# Patient Record
Sex: Female | Born: 1975 | Race: White | Hispanic: No | Smoking: Current every day smoker
Health system: Southern US, Community
[De-identification: ages and names within clinical notes are randomized; demographics above are authoritative.]

## PROBLEM LIST (undated history)

## (undated) DIAGNOSIS — R519 Headache, unspecified: Secondary | ICD-10-CM

## (undated) HISTORY — DX: Headache, unspecified: R51.9

## (undated) HISTORY — PX: APPENDECTOMY: SHX54

---

## 2009-02-25 ENCOUNTER — Other Ambulatory Visit: Admission: RE | Admit: 2009-02-25 | Discharge: 2009-02-25 | Payer: Self-pay | Admitting: Obstetrics and Gynecology

## 2009-06-21 ENCOUNTER — Emergency Department (HOSPITAL_COMMUNITY): Admission: EM | Admit: 2009-06-21 | Discharge: 2009-06-21 | Payer: Self-pay | Admitting: Emergency Medicine

## 2009-06-22 ENCOUNTER — Inpatient Hospital Stay (HOSPITAL_COMMUNITY): Admission: EM | Admit: 2009-06-22 | Discharge: 2009-06-27 | Payer: Self-pay | Admitting: Emergency Medicine

## 2009-06-22 ENCOUNTER — Encounter (INDEPENDENT_AMBULATORY_CARE_PROVIDER_SITE_OTHER): Payer: Self-pay | Admitting: General Surgery

## 2009-10-20 ENCOUNTER — Emergency Department (HOSPITAL_COMMUNITY): Admission: EM | Admit: 2009-10-20 | Discharge: 2009-10-20 | Payer: Self-pay | Admitting: Emergency Medicine

## 2010-05-13 LAB — HEPATIC FUNCTION PANEL
Albumin: 4.2 g/dL (ref 3.5–5.2)
Bilirubin, Direct: 0.1 mg/dL (ref 0.0–0.3)
Total Bilirubin: 0.3 mg/dL (ref 0.3–1.2)

## 2010-05-13 LAB — CBC
HCT: 39.5 % (ref 36.0–46.0)
Hemoglobin: 12.1 g/dL (ref 12.0–15.0)
MCHC: 35 g/dL (ref 30.0–36.0)
MCHC: 35.3 g/dL (ref 30.0–36.0)
MCV: 88.7 fL (ref 78.0–100.0)
MCV: 87.7 fL (ref 78.0–100.0)
MCV: 88 fL (ref 78.0–100.0)
Platelets: 146 10*3/uL — ABNORMAL LOW (ref 150–400)
Platelets: 162 10*3/uL (ref 150–400)
Platelets: 186 10*3/uL (ref 150–400)
RBC: 3.6 MIL/uL — ABNORMAL LOW (ref 3.87–5.11)
RBC: 3.84 MIL/uL — ABNORMAL LOW (ref 3.87–5.11)
RDW: 12.9 % (ref 11.5–15.5)

## 2010-05-13 LAB — WOUND CULTURE

## 2010-05-13 LAB — URINALYSIS, ROUTINE W REFLEX MICROSCOPIC
Leukocytes, UA: NEGATIVE
Nitrite: NEGATIVE
Specific Gravity, Urine: >1.03 — ABNORMAL HIGH (ref 1.005–1.030)
Urobilinogen, UA: 0.2 mg/dL (ref 0.0–1.0)

## 2010-05-13 LAB — RAPID URINE DRUG SCREEN, HOSP PERFORMED
Cocaine: NOT DETECTED
Opiates: NOT DETECTED

## 2010-05-13 LAB — BASIC METABOLIC PANEL
BUN: 10 mg/dL (ref 6–23)
CO2: 25 mEq/L (ref 19–32)
Calcium: 7.7 mg/dL — ABNORMAL LOW (ref 8.4–10.5)
Chloride: 106 mEq/L (ref 96–112)
Chloride: 107 mEq/L (ref 96–112)
Creatinine, Ser: 0.75 mg/dL (ref 0.4–1.2)
Creatinine, Ser: 0.83 mg/dL (ref 0.4–1.2)
Creatinine, Ser: 0.74 mg/dL (ref 0.4–1.2)
GFR calc Af Amer: >60 mL/min (ref >60)
GFR calc non Af Amer: >60 mL/min (ref >60)
Glucose, Bld: 132 mg/dL — ABNORMAL HIGH (ref 70–99)
Potassium: 4.3 mEq/L (ref 3.5–5.1)
Sodium: 134 mEq/L — ABNORMAL LOW (ref 135–145)
Sodium: 136 mEq/L (ref 135–145)
Sodium: 135 mEq/L (ref 135–145)

## 2010-05-13 LAB — DIFFERENTIAL
Basophils Absolute: 0.1 10*3/uL (ref 0.0–0.1)
Basophils Absolute: 0 10*3/uL (ref 0.0–0.1)
Basophils Absolute: 0.1 10*3/uL (ref 0.0–0.1)
Basophils Relative: 0 % (ref 0–1)
Basophils Relative: 0 % (ref 0–1)
Eosinophils Absolute: 0 10*3/uL (ref 0.0–0.7)
Eosinophils Absolute: 0.1 10*3/uL (ref 0.0–0.7)
Eosinophils Absolute: 0 10*3/uL (ref 0.0–0.7)
Eosinophils Absolute: 0 10*3/uL (ref 0.0–0.7)
Eosinophils Relative: 0 % (ref 0–5)
Eosinophils Relative: 1 % (ref 0–5)
Lymphocytes Relative: 9 % — ABNORMAL LOW (ref 12–46)
Lymphocytes Relative: 6 % — ABNORMAL LOW (ref 12–46)
Lymphs Abs: 1 10*3/uL (ref 0.7–4.0)
Monocytes Absolute: 0.4 10*3/uL (ref 0.1–1.0)
Monocytes Absolute: 0.4 10*3/uL (ref 0.1–1.0)
Monocytes Relative: 5 % (ref 3–12)
Neutro Abs: 10.1 10*3/uL — ABNORMAL HIGH (ref 1.7–7.7)
Neutro Abs: 17.6 10*3/uL — ABNORMAL HIGH (ref 1.7–7.7)
Neutrophils Relative %: 85 % — ABNORMAL HIGH (ref 43–77)
Neutrophils Relative %: 95 % — ABNORMAL HIGH (ref 43–77)

## 2010-05-13 LAB — ANAEROBIC CULTURE

## 2010-05-13 LAB — COMPREHENSIVE METABOLIC PANEL
ALT: 13 U/L (ref 0–35)
Albumin: 3.7 g/dL (ref 3.5–5.2)
BUN: 10 mg/dL (ref 6–23)
Chloride: 102 mEq/L (ref 96–112)
GFR calc Af Amer: >60 mL/min (ref >60)
GFR calc non Af Amer: 59 mL/min — ABNORMAL LOW (ref >60)

## 2010-05-13 LAB — POCT CARDIAC MARKERS
CKMB, poc: <1 ng/mL — ABNORMAL LOW (ref 1.0–8.0)
Troponin i, poc: <0.05 ng/mL (ref 0.00–0.09)

## 2010-05-13 LAB — URINE MICROSCOPIC-ADD ON

## 2010-05-13 LAB — WET PREP, GENITAL

## 2011-01-13 ENCOUNTER — Other Ambulatory Visit: Payer: Self-pay | Admitting: Adult Health

## 2011-01-13 ENCOUNTER — Other Ambulatory Visit (HOSPITAL_COMMUNITY)
Admission: RE | Admit: 2011-01-13 | Discharge: 2011-01-13 | Disposition: A | Discharge status: Home / Self Care | Payer: 59 | Source: Ambulatory Visit | Attending: Obstetrics and Gynecology | Admitting: Obstetrics and Gynecology

## 2011-01-13 DIAGNOSIS — Z01419 Encounter for gynecological examination (general) (routine) without abnormal findings: Secondary | ICD-10-CM | POA: Insufficient documentation

## 2011-01-13 DIAGNOSIS — Z113 Encounter for screening for infections with a predominantly sexual mode of transmission: Secondary | ICD-10-CM | POA: Insufficient documentation

## 2011-10-05 ENCOUNTER — Other Ambulatory Visit (HOSPITAL_COMMUNITY): Payer: Self-pay | Admitting: Pulmonary Disease

## 2011-10-05 ENCOUNTER — Ambulatory Visit (HOSPITAL_COMMUNITY)
Admission: RE | Admit: 2011-10-05 | Discharge: 2011-10-05 | Disposition: A | Discharge status: Home / Self Care | Payer: 59 | Source: Ambulatory Visit | Attending: Pulmonary Disease | Admitting: Pulmonary Disease

## 2011-10-05 DIAGNOSIS — M25552 Pain in left hip: Secondary | ICD-10-CM

## 2011-10-05 DIAGNOSIS — M25559 Pain in unspecified hip: Secondary | ICD-10-CM | POA: Insufficient documentation

## 2012-10-20 ENCOUNTER — Telehealth: Payer: Self-pay | Admitting: Adult Health

## 2012-10-20 NOTE — Telephone Encounter (Signed)
Called [facility], will fax prior authorization for pt to receive amitiza 8 mcg.

## 2012-11-09 ENCOUNTER — Other Ambulatory Visit: Payer: Self-pay | Admitting: Adult Health

## 2013-07-18 ENCOUNTER — Ambulatory Visit (HOSPITAL_COMMUNITY)
Admission: RE | Admit: 2013-07-18 | Discharge: 2013-07-18 | Disposition: A | Discharge status: Home / Self Care | Payer: 59 | Source: Ambulatory Visit | Attending: Pulmonary Disease | Admitting: Pulmonary Disease

## 2013-07-18 ENCOUNTER — Other Ambulatory Visit (HOSPITAL_COMMUNITY): Payer: Self-pay | Admitting: Pulmonary Disease

## 2013-07-18 DIAGNOSIS — M25519 Pain in unspecified shoulder: Secondary | ICD-10-CM | POA: Insufficient documentation

## 2013-07-18 DIAGNOSIS — M25559 Pain in unspecified hip: Secondary | ICD-10-CM | POA: Insufficient documentation

## 2013-07-18 DIAGNOSIS — M25569 Pain in unspecified knee: Secondary | ICD-10-CM | POA: Insufficient documentation

## 2013-12-25 ENCOUNTER — Other Ambulatory Visit: Payer: Self-pay | Admitting: *Deleted

## 2013-12-25 ENCOUNTER — Other Ambulatory Visit: Payer: Self-pay | Admitting: Adult Health

## 2013-12-25 MED ORDER — LUBIPROSTONE 8 MCG PO CAPS: ORAL_CAPSULE | ORAL | Status: DC

## 2014-11-01 ENCOUNTER — Ambulatory Visit (INDEPENDENT_AMBULATORY_CARE_PROVIDER_SITE_OTHER): Payer: Medicaid Other | Admitting: Otolaryngology

## 2014-11-01 DIAGNOSIS — H9209 Otalgia, unspecified ear: Secondary | ICD-10-CM

## 2014-11-01 DIAGNOSIS — H6121 Impacted cerumen, right ear: Secondary | ICD-10-CM

## 2014-11-01 DIAGNOSIS — H93293 Other abnormal auditory perceptions, bilateral: Secondary | ICD-10-CM | POA: Diagnosis not present

## 2014-11-29 ENCOUNTER — Ambulatory Visit (INDEPENDENT_AMBULATORY_CARE_PROVIDER_SITE_OTHER): Payer: Medicaid Other | Admitting: Otolaryngology

## 2014-11-29 DIAGNOSIS — H9209 Otalgia, unspecified ear: Secondary | ICD-10-CM | POA: Diagnosis not present

## 2015-09-09 ENCOUNTER — Other Ambulatory Visit (HOSPITAL_COMMUNITY): Payer: Self-pay | Admitting: Respiratory Therapy

## 2015-09-09 DIAGNOSIS — G4733 Obstructive sleep apnea (adult) (pediatric): Secondary | ICD-10-CM

## 2016-08-04 ENCOUNTER — Telehealth (HOSPITAL_COMMUNITY): Payer: Self-pay | Admitting: *Deleted

## 2016-08-04 NOTE — Telephone Encounter (Signed)
left voice message regarding appointment. 
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
## 2016-08-19 ENCOUNTER — Ambulatory Visit (INDEPENDENT_AMBULATORY_CARE_PROVIDER_SITE_OTHER): Payer: Medicaid Other | Admitting: Psychiatry

## 2016-08-19 ENCOUNTER — Encounter (INDEPENDENT_AMBULATORY_CARE_PROVIDER_SITE_OTHER): Payer: Self-pay

## 2016-08-19 VITALS — BP 126/77 | HR 78 | Ht 67.5 in | Wt 197.6 lb

## 2016-08-19 DIAGNOSIS — F411 Generalized anxiety disorder: Secondary | ICD-10-CM | POA: Diagnosis not present

## 2016-08-19 DIAGNOSIS — F329 Major depressive disorder, single episode, unspecified: Secondary | ICD-10-CM | POA: Diagnosis not present

## 2016-08-19 DIAGNOSIS — G40909 Epilepsy, unspecified, not intractable, without status epilepticus: Secondary | ICD-10-CM

## 2016-08-19 DIAGNOSIS — Z79899 Other long term (current) drug therapy: Secondary | ICD-10-CM

## 2016-08-19 DIAGNOSIS — K219 Gastro-esophageal reflux disease without esophagitis: Secondary | ICD-10-CM | POA: Diagnosis not present

## 2016-08-19 DIAGNOSIS — G8929 Other chronic pain: Secondary | ICD-10-CM

## 2016-08-19 DIAGNOSIS — Z818 Family history of other mental and behavioral disorders: Secondary | ICD-10-CM

## 2016-08-19 MED ORDER — MIRTAZAPINE 7.5 MG PO TABS: ORAL_TABLET | ORAL | 1 refills | Status: DC

## 2016-08-19 NOTE — Patient Instructions (Signed)
1. Start mirtazapine 7.5 mg at night for one week, then 15 mg at night 2. Decrease lamotrigine 25 mg twice a day for three days, then discontinue 3. Discontinue buspar 4. Return to clinic in one month for 30 mins 5. Referral to therapy (Ms. Peggy Bynum)

## 2016-09-09 ENCOUNTER — Telehealth (HOSPITAL_COMMUNITY): Payer: Self-pay | Admitting: *Deleted

## 2016-09-09 NOTE — Telephone Encounter (Signed)
voice message from patient, please call her regarding her Remeron.
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
## 2016-09-16 ENCOUNTER — Ambulatory Visit (HOSPITAL_COMMUNITY): Payer: Self-pay | Admitting: Psychiatry

## 2016-09-16 ENCOUNTER — Other Ambulatory Visit (HOSPITAL_COMMUNITY): Payer: Self-pay | Admitting: Psychiatry

## 2016-09-16 ENCOUNTER — Telehealth (HOSPITAL_COMMUNITY): Payer: Self-pay | Admitting: *Deleted

## 2016-09-16 MED ORDER — MIRTAZAPINE 15 MG PO TABS: 15.0000 mg | ORAL_TABLET | Freq: Every day | ORAL | 0 refills | Status: DC

## 2016-09-16 NOTE — Telephone Encounter (Signed)
lmtcb

## 2016-09-16 NOTE — Telephone Encounter (Signed)
Prior authorization for Remeron received. Called [place] [facility] spoke with Morrie Sheldonshley who states that no authorization is required but the pharmacy needs to call for an over ride code. Call interaction [id number]. Called to notify the pharmacy. Pharmacist stated that patient filled medication at another [facility] pharmacy 4 days ago.

## 2016-09-16 NOTE — Telephone Encounter (Signed)
Per pt chart, provider filled her Remeron on 08-19-2016 with 1 refill. Called pt today to get more information. Per pt, the way the doctor wrote the script out she should not be out of refills but the insurance is only wanting to fill 30 days worth instead of 60 tabs. Informed pt that means her Remeron is needing a Prior Auth and office will send it to the person that does it and see if she could get it completed today. Per pt, she's wondering if Dr. Vanetta ShawlHisada could at least give her 15 mg and she can at least cut that in half while she waits because she is currently not taking the Remeron due to being out due to not having enough. Pt number is [phone].

## 2016-09-16 NOTE — Telephone Encounter (Signed)
Ordered mirtazapine 15 mg at night for one month. (She may start from 7.5 mg at night for one week then increase to 15 mg qhs if she has not received 7.5 mg)

## 2016-09-17 NOTE — Telephone Encounter (Signed)
Spoke with pt and informed her that the person that does PA for medications for the office stated that pt 7.5 mg leading to 15 mg, she was informed from the insurance that the pharmacy needed to call them for an over ride code. Also informed pt that provider sent the 15 mg to her pharmacy as well. Pt verbalized understanding and stated she will call her pharmacy when they open up.

## 2016-09-17 NOTE — Telephone Encounter (Signed)
noted 
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
## 2016-09-23 ENCOUNTER — Ambulatory Visit (INDEPENDENT_AMBULATORY_CARE_PROVIDER_SITE_OTHER): Payer: Medicaid Other | Admitting: Psychiatry

## 2016-09-23 ENCOUNTER — Encounter (HOSPITAL_COMMUNITY): Payer: Self-pay | Admitting: Psychiatry

## 2016-09-23 VITALS — BP 109/88 | HR 90 | Ht 67.5 in | Wt 192.8 lb

## 2016-09-23 DIAGNOSIS — F411 Generalized anxiety disorder: Secondary | ICD-10-CM | POA: Diagnosis not present

## 2016-09-23 DIAGNOSIS — F17210 Nicotine dependence, cigarettes, uncomplicated: Secondary | ICD-10-CM

## 2016-09-23 DIAGNOSIS — G4700 Insomnia, unspecified: Secondary | ICD-10-CM

## 2016-09-23 MED ORDER — ZOLPIDEM TARTRATE 5 MG PO TABS: ORAL_TABLET | ORAL | 0 refills | Status: DC

## 2016-09-23 MED ORDER — MIRTAZAPINE 15 MG PO TABS: 15.0000 mg | ORAL_TABLET | Freq: Every day | ORAL | 1 refills | Status: DC

## 2016-09-23 NOTE — Patient Instructions (Addendum)
1. Continue mirtazapine 15 mg at night 2. Start amibien 2.5-5 mg at night as needed for sleep 3. Return to clinic in six weeks for 30 mins
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
## 2016-11-03 ENCOUNTER — Telehealth (HOSPITAL_COMMUNITY): Payer: Self-pay | Admitting: *Deleted

## 2016-11-03 NOTE — Telephone Encounter (Signed)
returned phone call to patient, informed her that her appointment scheduled 11/04/16 has been cancelled as she requested.    Please call to reschedule.

## 2016-11-04 ENCOUNTER — Ambulatory Visit (HOSPITAL_COMMUNITY): Payer: Self-pay | Admitting: Psychiatry

## 2016-11-11 ENCOUNTER — Telehealth (HOSPITAL_COMMUNITY): Payer: Self-pay | Admitting: Psychiatry
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
## 2016-11-27 ENCOUNTER — Ambulatory Visit (INDEPENDENT_AMBULATORY_CARE_PROVIDER_SITE_OTHER): Payer: Medicaid Other | Admitting: Psychiatry

## 2016-11-27 VITALS — BP 100/76 | HR 76 | Ht 67.52 in | Wt 177.6 lb

## 2016-11-27 DIAGNOSIS — G8929 Other chronic pain: Secondary | ICD-10-CM

## 2016-11-27 DIAGNOSIS — G4700 Insomnia, unspecified: Secondary | ICD-10-CM | POA: Diagnosis not present

## 2016-11-27 DIAGNOSIS — Z79899 Other long term (current) drug therapy: Secondary | ICD-10-CM | POA: Diagnosis not present

## 2016-11-27 DIAGNOSIS — F411 Generalized anxiety disorder: Secondary | ICD-10-CM | POA: Diagnosis not present

## 2016-11-27 DIAGNOSIS — G43909 Migraine, unspecified, not intractable, without status migrainosus: Secondary | ICD-10-CM

## 2016-11-27 DIAGNOSIS — K219 Gastro-esophageal reflux disease without esophagitis: Secondary | ICD-10-CM | POA: Diagnosis not present

## 2016-11-27 DIAGNOSIS — F17200 Nicotine dependence, unspecified, uncomplicated: Secondary | ICD-10-CM | POA: Diagnosis not present

## 2016-11-27 MED ORDER — LORAZEPAM 0.5 MG PO TABS: 0.5000 mg | ORAL_TABLET | Freq: Every day | ORAL | 0 refills | Status: DC | PRN

## 2016-11-27 MED ORDER — MIRTAZAPINE 30 MG PO TABS: 30.0000 mg | ORAL_TABLET | Freq: Every day | ORAL | 0 refills | Status: DC

## 2016-11-27 MED ORDER — LORAZEPAM 0.5 MG PO TABS: 0.5000 mg | ORAL_TABLET | Freq: Every day | ORAL | 1 refills | Status: DC | PRN

## 2016-11-27 NOTE — Patient Instructions (Signed)
1. Increase mirtazapine 30 mg at night 2. Start ativan 0.5 mg daily as needed for anxiety 3. Return to clinic in six to seven weeks for 30 mins

## 2016-12-30 ENCOUNTER — Other Ambulatory Visit (HOSPITAL_COMMUNITY): Payer: Self-pay | Admitting: Psychiatry

## 2016-12-30 MED ORDER — MIRTAZAPINE 30 MG PO TABS: 30.0000 mg | ORAL_TABLET | Freq: Every day | ORAL | 0 refills | Status: DC
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
## 2017-01-12 ENCOUNTER — Encounter (HOSPITAL_COMMUNITY): Payer: Self-pay | Admitting: Psychiatry

## 2017-01-12 ENCOUNTER — Ambulatory Visit (INDEPENDENT_AMBULATORY_CARE_PROVIDER_SITE_OTHER): Payer: Medicaid Other | Admitting: Psychiatry

## 2017-01-12 VITALS — BP 116/76 | HR 90 | Ht 67.52 in | Wt 173.0 lb

## 2017-01-12 DIAGNOSIS — R450 Nervousness: Secondary | ICD-10-CM | POA: Diagnosis not present

## 2017-01-12 DIAGNOSIS — R45850 Homicidal ideations: Secondary | ICD-10-CM

## 2017-01-12 DIAGNOSIS — R51 Headache: Secondary | ICD-10-CM | POA: Diagnosis not present

## 2017-01-12 DIAGNOSIS — F411 Generalized anxiety disorder: Secondary | ICD-10-CM

## 2017-01-12 DIAGNOSIS — G43909 Migraine, unspecified, not intractable, without status migrainosus: Secondary | ICD-10-CM | POA: Diagnosis not present

## 2017-01-12 DIAGNOSIS — F17210 Nicotine dependence, cigarettes, uncomplicated: Secondary | ICD-10-CM | POA: Diagnosis not present

## 2017-01-12 DIAGNOSIS — F419 Anxiety disorder, unspecified: Secondary | ICD-10-CM

## 2017-01-12 DIAGNOSIS — K219 Gastro-esophageal reflux disease without esophagitis: Secondary | ICD-10-CM

## 2017-01-12 DIAGNOSIS — Z818 Family history of other mental and behavioral disorders: Secondary | ICD-10-CM | POA: Diagnosis not present

## 2017-01-12 DIAGNOSIS — G4700 Insomnia, unspecified: Secondary | ICD-10-CM | POA: Diagnosis not present

## 2017-01-12 DIAGNOSIS — F4310 Post-traumatic stress disorder, unspecified: Secondary | ICD-10-CM

## 2017-01-12 MED ORDER — DULOXETINE HCL 30 MG PO CPEP: 30.0000 mg | ORAL_CAPSULE | Freq: Every day | ORAL | 0 refills | Status: DC

## 2017-01-12 MED ORDER — MIRTAZAPINE 30 MG PO TABS: 30.0000 mg | ORAL_TABLET | Freq: Every day | ORAL | 0 refills | Status: DC

## 2017-01-12 NOTE — Patient Instructions (Signed)
1. Continue mirtazapine 30 mg at night 2. Start duloxetine 30 mg daily  3. Discontinue ativan  4. Return to clinic in one month for 30 mins

## 2017-01-19 DIAGNOSIS — Z1231 Encounter for screening mammogram for malignant neoplasm of breast: Secondary | ICD-10-CM | POA: Diagnosis not present
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
## 2017-02-12 ENCOUNTER — Ambulatory Visit (INDEPENDENT_AMBULATORY_CARE_PROVIDER_SITE_OTHER): Payer: Medicaid Other | Admitting: Psychiatry

## 2017-02-12 ENCOUNTER — Encounter (HOSPITAL_COMMUNITY): Payer: Self-pay | Admitting: Psychiatry

## 2017-02-12 VITALS — BP 110/77 | HR 77 | Ht 67.0 in | Wt 164.0 lb

## 2017-02-12 DIAGNOSIS — Z79899 Other long term (current) drug therapy: Secondary | ICD-10-CM | POA: Diagnosis not present

## 2017-02-12 DIAGNOSIS — F17200 Nicotine dependence, unspecified, uncomplicated: Secondary | ICD-10-CM | POA: Diagnosis not present

## 2017-02-12 DIAGNOSIS — G43909 Migraine, unspecified, not intractable, without status migrainosus: Secondary | ICD-10-CM | POA: Diagnosis not present

## 2017-02-12 DIAGNOSIS — K219 Gastro-esophageal reflux disease without esophagitis: Secondary | ICD-10-CM

## 2017-02-12 DIAGNOSIS — F4310 Post-traumatic stress disorder, unspecified: Secondary | ICD-10-CM | POA: Diagnosis not present

## 2017-02-12 DIAGNOSIS — Z818 Family history of other mental and behavioral disorders: Secondary | ICD-10-CM | POA: Diagnosis not present

## 2017-02-12 DIAGNOSIS — G8929 Other chronic pain: Secondary | ICD-10-CM

## 2017-02-12 DIAGNOSIS — F411 Generalized anxiety disorder: Secondary | ICD-10-CM | POA: Diagnosis not present

## 2017-02-12 MED ORDER — MIRTAZAPINE 30 MG PO TABS: 30.0000 mg | ORAL_TABLET | Freq: Every day | ORAL | 1 refills | Status: DC

## 2017-02-12 MED ORDER — DULOXETINE HCL 60 MG PO CPEP: 60.0000 mg | ORAL_CAPSULE | Freq: Every day | ORAL | 1 refills | Status: DC

## 2017-02-12 NOTE — Patient Instructions (Signed)
1. Continue mirtazapine 30 mg a night  2. Increase duloxetine 60 mg daily  3. Return to clinic in two months for 15 mins

## 2017-04-06 ENCOUNTER — Encounter (HOSPITAL_COMMUNITY): Payer: Self-pay | Admitting: Psychiatry

## 2017-04-06 ENCOUNTER — Ambulatory Visit (INDEPENDENT_AMBULATORY_CARE_PROVIDER_SITE_OTHER): Payer: Medicaid Other | Admitting: Psychiatry

## 2017-04-06 VITALS — BP 104/71 | HR 97 | Ht 67.0 in | Wt 173.0 lb

## 2017-04-06 DIAGNOSIS — F17210 Nicotine dependence, cigarettes, uncomplicated: Secondary | ICD-10-CM | POA: Diagnosis not present

## 2017-04-06 DIAGNOSIS — Z818 Family history of other mental and behavioral disorders: Secondary | ICD-10-CM | POA: Diagnosis not present

## 2017-04-06 DIAGNOSIS — F4310 Post-traumatic stress disorder, unspecified: Secondary | ICD-10-CM

## 2017-04-06 DIAGNOSIS — F411 Generalized anxiety disorder: Secondary | ICD-10-CM | POA: Diagnosis not present

## 2017-04-06 MED ORDER — DULOXETINE HCL 60 MG PO CPEP: ORAL_CAPSULE | ORAL | 1 refills | Status: DC

## 2017-04-06 MED ORDER — DULOXETINE HCL 30 MG PO CPEP: ORAL_CAPSULE | ORAL | 1 refills | Status: DC

## 2017-04-06 MED ORDER — MIRTAZAPINE 30 MG PO TABS: 30.0000 mg | ORAL_TABLET | Freq: Every day | ORAL | 1 refills | Status: DC
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
## 2017-04-06 NOTE — Patient Instructions (Signed)
1. Continue mirtazapine 30 mg a night  2. Increase duloxetine 90 mg daily (60 mg + 30 mg) 3. Return to clinic in two months for 15 mins

## 2017-04-13 ENCOUNTER — Ambulatory Visit (HOSPITAL_COMMUNITY): Payer: Self-pay | Admitting: Psychiatry

## 2017-05-31 ENCOUNTER — Other Ambulatory Visit (HOSPITAL_COMMUNITY): Payer: Self-pay | Admitting: Psychiatry

## 2017-05-31 MED ORDER — DULOXETINE HCL 30 MG PO CPEP: ORAL_CAPSULE | ORAL | 1 refills | Status: DC

## 2017-06-01 ENCOUNTER — Other Ambulatory Visit (HOSPITAL_COMMUNITY): Payer: Self-pay | Admitting: Psychiatry

## 2017-06-01 MED ORDER — DULOXETINE HCL 60 MG PO CPEP: ORAL_CAPSULE | ORAL | 1 refills | Status: DC
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
## 2017-06-14 ENCOUNTER — Encounter (HOSPITAL_COMMUNITY): Payer: Self-pay | Admitting: Psychiatry

## 2017-06-14 ENCOUNTER — Ambulatory Visit (INDEPENDENT_AMBULATORY_CARE_PROVIDER_SITE_OTHER): Payer: Medicaid Other | Admitting: Psychiatry

## 2017-06-14 VITALS — BP 115/80 | HR 92 | Ht 67.0 in | Wt 186.0 lb

## 2017-06-14 DIAGNOSIS — G4700 Insomnia, unspecified: Secondary | ICD-10-CM | POA: Diagnosis not present

## 2017-06-14 DIAGNOSIS — F411 Generalized anxiety disorder: Secondary | ICD-10-CM

## 2017-06-14 DIAGNOSIS — F17210 Nicotine dependence, cigarettes, uncomplicated: Secondary | ICD-10-CM | POA: Diagnosis not present

## 2017-06-14 DIAGNOSIS — F909 Attention-deficit hyperactivity disorder, unspecified type: Secondary | ICD-10-CM | POA: Diagnosis not present

## 2017-06-14 DIAGNOSIS — F4310 Post-traumatic stress disorder, unspecified: Secondary | ICD-10-CM

## 2017-06-14 DIAGNOSIS — Z818 Family history of other mental and behavioral disorders: Secondary | ICD-10-CM

## 2017-06-14 DIAGNOSIS — R450 Nervousness: Secondary | ICD-10-CM | POA: Diagnosis not present

## 2017-06-14 LAB — TSH: TSH: 0.72 m[IU]/L

## 2017-06-14 MED ORDER — DULOXETINE HCL 30 MG PO CPEP: ORAL_CAPSULE | ORAL | 1 refills | Status: DC

## 2017-06-14 MED ORDER — BUPROPION HCL ER (XL) 150 MG PO TB24: 150.0000 mg | ORAL_TABLET | Freq: Every day | ORAL | 1 refills | Status: DC

## 2017-06-14 MED ORDER — MIRTAZAPINE 30 MG PO TABS: 30.0000 mg | ORAL_TABLET | Freq: Every day | ORAL | 1 refills | Status: DC

## 2017-06-14 MED ORDER — DULOXETINE HCL 60 MG PO CPEP: ORAL_CAPSULE | ORAL | 1 refills | Status: DC

## 2017-06-14 NOTE — Patient Instructions (Addendum)
1. Continue mirtazapine 30 mg a night  2. Continue duloxetine 90 mg daily  3. Start wellbutrin 150 mg daily  4. Return to clinic in one month for 15 mins 5. Referral to neuropsychology testing
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
## 2017-07-13 ENCOUNTER — Encounter (HOSPITAL_COMMUNITY): Payer: Self-pay | Admitting: Psychiatry

## 2017-07-13 ENCOUNTER — Ambulatory Visit (INDEPENDENT_AMBULATORY_CARE_PROVIDER_SITE_OTHER): Payer: Medicaid Other | Admitting: Psychiatry

## 2017-07-13 VITALS — BP 117/79 | HR 87 | Ht 67.0 in | Wt 188.0 lb

## 2017-07-13 DIAGNOSIS — Z79899 Other long term (current) drug therapy: Secondary | ICD-10-CM | POA: Diagnosis not present

## 2017-07-13 DIAGNOSIS — F17200 Nicotine dependence, unspecified, uncomplicated: Secondary | ICD-10-CM | POA: Diagnosis not present

## 2017-07-13 DIAGNOSIS — F4310 Post-traumatic stress disorder, unspecified: Secondary | ICD-10-CM | POA: Diagnosis not present

## 2017-07-13 DIAGNOSIS — G43909 Migraine, unspecified, not intractable, without status migrainosus: Secondary | ICD-10-CM

## 2017-07-13 DIAGNOSIS — Z818 Family history of other mental and behavioral disorders: Secondary | ICD-10-CM

## 2017-07-13 DIAGNOSIS — K219 Gastro-esophageal reflux disease without esophagitis: Secondary | ICD-10-CM

## 2017-07-13 DIAGNOSIS — F411 Generalized anxiety disorder: Secondary | ICD-10-CM | POA: Diagnosis not present

## 2017-07-13 DIAGNOSIS — G8929 Other chronic pain: Secondary | ICD-10-CM | POA: Diagnosis not present

## 2017-07-13 MED ORDER — DULOXETINE HCL 30 MG PO CPEP: ORAL_CAPSULE | ORAL | 1 refills | Status: DC

## 2017-07-13 MED ORDER — DULOXETINE HCL 60 MG PO CPEP: ORAL_CAPSULE | ORAL | 1 refills | Status: DC

## 2017-07-13 NOTE — Patient Instructions (Addendum)
1. Decrease mirtazapine 15 mg at night  2. Continue duloxetine 90 mg daily  3. Discontinue Wellbutrin 4. Return to clinic in two months for 15 mins

## 2017-08-13 ENCOUNTER — Encounter: Payer: Medicaid Other | Admitting: Psychology

## 2017-09-01 ENCOUNTER — Other Ambulatory Visit (HOSPITAL_COMMUNITY): Payer: Self-pay | Admitting: Psychiatry

## 2017-09-08 ENCOUNTER — Other Ambulatory Visit (HOSPITAL_COMMUNITY): Payer: Self-pay | Admitting: *Deleted

## 2017-09-08 MED ORDER — MIRTAZAPINE 30 MG PO TABS: 30.0000 mg | ORAL_TABLET | Freq: Every day | ORAL | 0 refills | Status: DC

## 2017-09-08 NOTE — Telephone Encounter (Signed)
Refill request sent by [facility]. Chart reviewed, next appt 7/23 with last fill 08/03/17. Per Dr. Vanetta ShawlHisada OK to refill for a 30 day supply.

## 2017-09-10 NOTE — Progress Notes (Deleted)
BH MD/PA/NP OP Progress Note  09/10/2017 8:35 AM Anna Shepherd  MRN:  [id number]  Chief Complaint:  HPI: *** Visit Diagnosis: No diagnosis found.  Past Psychiatric History: Please see initial evaluation for full details. I have reviewed the history. No updates at this time.     Past Medical History: No past medical history on file. No past surgical history on file.  Family Psychiatric History: Please see initial evaluation for full details. I have reviewed the history. No updates at this time.     Family History:  Family History  Problem Relation Age of Onset  . Depression Mother   . Depression Sister     Social History:  Social History   Socioeconomic History  . Marital status: Married    Spouse name: Not on file  . Number of children: Not on file  . Years of education: Not on file  . Highest education level: Not on file  Occupational History  . Not on file  Social Needs  . Financial resource strain: Not on file  . Food insecurity:    Worry: Not on file    Inability: Not on file  . Transportation needs:    Medical: Not on file    Non-medical: Not on file  Tobacco Use  . Smoking status: Current Every Day Smoker    Packs/day: 1.00  . Smokeless tobacco: Never Used  Substance and Sexual Activity  . Alcohol use: No    Comment: 09-23-2016 PER PT NO  . Drug use: No    Comment: 09-23-2016 PER PT NO   . Sexual activity: Not on file  Lifestyle  . Physical activity:    Days per week: Not on file    Minutes per session: Not on file  . Stress: Not on file  Relationships  . Social connections:    Talks on phone: Not on file    Gets together: Not on file    Attends religious service: Not on file    Active member of club or organization: Not on file    Attends meetings of clubs or organizations: Not on file    Relationship status: Not on file  Other Topics Concern  . Not on file  Social History Narrative  . Not on file    Allergies:  Allergies  Allergen  Reactions  . Azithromycin     Metabolic Disorder Labs: No results found for: HGBA1C, MPG No results found for: PROLACTIN No results found for: CHOL, TRIG, HDL, CHOLHDL, VLDL, LDLCALC Lab Results  Component Value Date   TSH 0.72 06/14/2017    Therapeutic Level Labs: No results found for: LITHIUM No results found for: VALPROATE No components found for:  CBMZ  Current Medications: Current Outpatient Medications  Medication Sig Dispense Refill  . celecoxib (CELEBREX) 200 MG capsule Take 200 mg by mouth daily.  5  . DULoxetine (CYMBALTA) 30 MG capsule Take total of 90 mg daily (60 mg +30 mg) 30 capsule 1  . DULoxetine (CYMBALTA) 60 MG capsule Take total of 90 mg daily (60 mg +30 mg) 30 capsule 1  . mirtazapine (REMERON) 30 MG tablet Take 1 tablet (30 mg total) by mouth at bedtime. 30 tablet 0  . omeprazole (PRILOSEC) 20 MG capsule Take 20 mg by mouth daily.  11  . propranolol (INDERAL) 20 MG tablet Take 20 mg by mouth 2 (two) times daily.  12   No current facility-administered medications for this visit.      Musculoskeletal: Strength &  Muscle Tone: within normal limits Gait & Station: normal Patient leans: N/A  Psychiatric Specialty Exam: ROS  There were no vitals taken for this visit.There is no height or weight on file to calculate BMI.  General Appearance: Fairly Groomed  Eye Contact:  Good  Speech:  Clear and Coherent  Volume:  Normal  Mood:  {BHH MOOD:22306}  Affect:  {Affect (PAA):22687}  Thought Process:  Coherent  Orientation:  Full (Time, Place, and Person)  Thought Content: Logical   Suicidal Thoughts:  {ST/HT (PAA):22692}  Homicidal Thoughts:  {ST/HT (PAA):22692}  Memory:  Immediate;   Good  Judgement:  {Judgement (PAA):22694}  Insight:  {Insight (PAA):22695}  Psychomotor Activity:  Normal  Concentration:  Concentration: Good and Attention Span: Good  Recall:  Good  Fund of Knowledge: Good  Language: Good  Akathisia:  No  Handed:  Right  AIMS (if  indicated): not done  Assets:  Communication Skills Desire for Improvement  ADL's:  Intact  Cognition: WNL  Sleep:  {BHH GOOD/FAIR/POOR:22877}   Screenings:   Assessment and Plan:  Anna Shepherd is a 42 y.o. year old female with a history of anxiety, PTSD, migraine, chronic pain, GERD , who presents for follow up appointment for No diagnosis found.  # GAD # PTSD  Although the patient reports overall improvement in anxiety and PTSD symptoms since the last appointment, she has had weight gain.  We will taper down mirtazapine (for anxiety) to see if it helps for weight loss. Will continue current dose of duloxetine to target anxiety and PTSD; will consider taper down this medication if the patient continues to gain weight despite changing mirtazapine dose.   # r/o ADHD Patient continues to endorse difficulty sustaining attention despite overall improvement in her mood symptoms. She was never diagnosed with ADHD, although she was in special class as a child. Referred to neuropsychology testing. Will discontinue Wellbutrin given patient had adverse reaction of irritability. Will consider stimulant after formal evaluation, which would be also beneficial for appetite suppression.   Plan  1. Decrease mirtazapine 15 mg at night  2. Continue duloxetine 90 mg daily  3. Discontinue Wellbutrin 4.Return to clinic in two months for15 mins 5. Referral to neuropsychology testing 6. TSH reviewed; wnl (She has a couple of ativan 0.5 mg left)    Neysa Hottereina Saniyya Gau, MD 09/10/2017, 8:35 AM

## 2017-09-14 ENCOUNTER — Ambulatory Visit (HOSPITAL_COMMUNITY): Payer: Self-pay | Admitting: Psychiatry
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
## 2017-09-27 ENCOUNTER — Ambulatory Visit (INDEPENDENT_AMBULATORY_CARE_PROVIDER_SITE_OTHER): Payer: Medicaid Other | Admitting: Psychiatry

## 2017-09-27 VITALS — BP 113/77 | HR 84 | Ht 67.0 in | Wt 196.0 lb

## 2017-09-27 DIAGNOSIS — F4310 Post-traumatic stress disorder, unspecified: Secondary | ICD-10-CM

## 2017-09-27 DIAGNOSIS — F411 Generalized anxiety disorder: Secondary | ICD-10-CM | POA: Diagnosis not present

## 2017-09-27 MED ORDER — DULOXETINE HCL 60 MG PO CPEP: 120.0000 mg | ORAL_CAPSULE | Freq: Every day | ORAL | 2 refills | Status: DC

## 2017-09-27 NOTE — Patient Instructions (Signed)
1. Decrease mirtazapine 15 mg night for one week, then discontinue 2. Increase duloxetine 120 mg daily  3. Return to clinic in three months for15 mins

## 2017-11-22 DIAGNOSIS — K210 Gastro-esophageal reflux disease with esophagitis: Secondary | ICD-10-CM | POA: Diagnosis not present

## 2017-11-22 DIAGNOSIS — G43919 Migraine, unspecified, intractable, without status migrainosus: Secondary | ICD-10-CM | POA: Diagnosis not present

## 2017-11-22 DIAGNOSIS — F419 Anxiety disorder, unspecified: Secondary | ICD-10-CM | POA: Diagnosis not present

## 2017-11-22 DIAGNOSIS — H6692 Otitis media, unspecified, left ear: Secondary | ICD-10-CM | POA: Diagnosis not present
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
## 2017-12-28 ENCOUNTER — Ambulatory Visit (INDEPENDENT_AMBULATORY_CARE_PROVIDER_SITE_OTHER): Payer: Medicaid Other | Admitting: Psychiatry

## 2017-12-28 VITALS — BP 105/72 | HR 86 | Ht 67.0 in | Wt 187.0 lb

## 2017-12-28 DIAGNOSIS — F411 Generalized anxiety disorder: Secondary | ICD-10-CM

## 2017-12-28 DIAGNOSIS — F4310 Post-traumatic stress disorder, unspecified: Secondary | ICD-10-CM | POA: Diagnosis not present

## 2017-12-28 MED ORDER — TRAZODONE HCL 50 MG PO TABS: ORAL_TABLET | ORAL | 2 refills | Status: DC

## 2017-12-28 MED ORDER — DULOXETINE HCL 60 MG PO CPEP: 120.0000 mg | ORAL_CAPSULE | Freq: Every day | ORAL | 2 refills | Status: DC

## 2017-12-28 MED ORDER — LORAZEPAM 0.5 MG PO TABS: 0.5000 mg | ORAL_TABLET | Freq: Every day | ORAL | 0 refills | Status: DC | PRN

## 2017-12-28 NOTE — Patient Instructions (Signed)
1.Continue duloxetine 120 mg daily  2.Start Trazodone 25-50 mg at night as needed for sleep 3. Continue ativan 0.5 mg daily as needed for anxiety  4. Return to clinic in three months for 15 mins

## 2018-03-25 ENCOUNTER — Other Ambulatory Visit (HOSPITAL_COMMUNITY): Payer: Self-pay | Admitting: Psychiatry

## 2018-03-25 MED ORDER — TRAZODONE HCL 50 MG PO TABS: ORAL_TABLET | ORAL | 0 refills | Status: DC

## 2018-03-25 MED ORDER — DULOXETINE HCL 60 MG PO CPEP: 120.0000 mg | ORAL_CAPSULE | Freq: Every day | ORAL | 0 refills | Status: DC

## 2018-03-30 ENCOUNTER — Ambulatory Visit (HOSPITAL_COMMUNITY): Payer: Medicaid Other | Admitting: Psychiatry
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
## 2018-04-11 ENCOUNTER — Encounter (HOSPITAL_COMMUNITY): Payer: Self-pay | Admitting: Psychiatry

## 2018-04-11 ENCOUNTER — Ambulatory Visit (INDEPENDENT_AMBULATORY_CARE_PROVIDER_SITE_OTHER): Payer: Medicaid Other | Admitting: Psychiatry

## 2018-04-11 VITALS — BP 103/65 | HR 85 | Ht 67.0 in | Wt 179.0 lb

## 2018-04-11 DIAGNOSIS — F411 Generalized anxiety disorder: Secondary | ICD-10-CM | POA: Diagnosis not present

## 2018-04-11 DIAGNOSIS — F4310 Post-traumatic stress disorder, unspecified: Secondary | ICD-10-CM | POA: Diagnosis not present

## 2018-04-11 MED ORDER — TRAZODONE HCL 50 MG PO TABS: ORAL_TABLET | ORAL | 3 refills | Status: DC

## 2018-04-11 MED ORDER — DULOXETINE HCL 60 MG PO CPEP: 120.0000 mg | ORAL_CAPSULE | Freq: Every day | ORAL | 3 refills | Status: DC

## 2018-04-11 NOTE — Patient Instructions (Signed)
1.Continue duloxetine 120 mg daily  2. Continue Trazodone 25-50 mg at night as needed for sleep 3. Continue ativan 0.5 mg daily as needed for anxiety  4. Return to clinic in four months for 15 mins

## 2018-06-01 DIAGNOSIS — M546 Pain in thoracic spine: Secondary | ICD-10-CM | POA: Diagnosis not present

## 2018-06-01 DIAGNOSIS — F321 Major depressive disorder, single episode, moderate: Secondary | ICD-10-CM | POA: Diagnosis not present

## 2018-06-01 DIAGNOSIS — F419 Anxiety disorder, unspecified: Secondary | ICD-10-CM | POA: Diagnosis not present

## 2018-06-01 DIAGNOSIS — R109 Unspecified abdominal pain: Secondary | ICD-10-CM | POA: Diagnosis not present
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
## 2018-08-08 ENCOUNTER — Other Ambulatory Visit: Payer: Self-pay

## 2018-08-08 ENCOUNTER — Encounter (HOSPITAL_COMMUNITY): Payer: Self-pay | Admitting: Psychiatry

## 2018-08-08 ENCOUNTER — Ambulatory Visit (INDEPENDENT_AMBULATORY_CARE_PROVIDER_SITE_OTHER): Payer: Medicaid Other | Admitting: Psychiatry

## 2018-08-08 DIAGNOSIS — F411 Generalized anxiety disorder: Secondary | ICD-10-CM | POA: Diagnosis not present

## 2018-08-08 DIAGNOSIS — F4310 Post-traumatic stress disorder, unspecified: Secondary | ICD-10-CM

## 2018-08-08 MED ORDER — DULOXETINE HCL 60 MG PO CPEP: 120.0000 mg | ORAL_CAPSULE | Freq: Every day | ORAL | 3 refills | Status: DC

## 2018-08-08 NOTE — Patient Instructions (Signed)
1.Continueduloxetine 120 mg daily 2.Increase Trazodone 50-75 mg at night as needed for sleep  3. Continue ativan 0.5 mg daily as needed for anxiety  4. Next appointment: 10/5 at 1:40
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
## 2018-11-22 DIAGNOSIS — Z23 Encounter for immunization: Secondary | ICD-10-CM | POA: Diagnosis not present

## 2018-11-28 ENCOUNTER — Other Ambulatory Visit: Payer: Self-pay

## 2018-11-28 ENCOUNTER — Ambulatory Visit (INDEPENDENT_AMBULATORY_CARE_PROVIDER_SITE_OTHER): Payer: Medicaid Other | Admitting: Psychiatry

## 2018-11-28 ENCOUNTER — Encounter (HOSPITAL_COMMUNITY): Payer: Self-pay | Admitting: Psychiatry

## 2018-11-28 DIAGNOSIS — F4310 Post-traumatic stress disorder, unspecified: Secondary | ICD-10-CM

## 2018-11-28 DIAGNOSIS — F411 Generalized anxiety disorder: Secondary | ICD-10-CM | POA: Diagnosis not present

## 2018-11-28 MED ORDER — DULOXETINE HCL 60 MG PO CPEP: 120.0000 mg | ORAL_CAPSULE | Freq: Every day | ORAL | 3 refills | Status: DC

## 2018-11-28 MED ORDER — LORAZEPAM 0.5 MG PO TABS: 0.5000 mg | ORAL_TABLET | Freq: Every day | ORAL | 0 refills | Status: DC | PRN

## 2018-11-28 NOTE — Patient Instructions (Signed)
1.Continueduloxetine 120 mg daily 2. Continue lorazepam 0.5 mg daily as needed for anxiety 4.Next appointment: in late January

## 2019-02-22 ENCOUNTER — Emergency Department (HOSPITAL_COMMUNITY)
Admission: EM | Admit: 2019-02-22 | Discharge: 2019-02-22 | Disposition: A | Discharge status: Home / Self Care | Payer: Medicaid Other | Attending: Emergency Medicine | Admitting: Emergency Medicine

## 2019-02-22 ENCOUNTER — Encounter (HOSPITAL_COMMUNITY): Payer: Self-pay | Admitting: Emergency Medicine

## 2019-02-22 ENCOUNTER — Other Ambulatory Visit: Payer: Self-pay

## 2019-02-22 DIAGNOSIS — S060X0A Concussion without loss of consciousness, initial encounter: Secondary | ICD-10-CM | POA: Diagnosis not present

## 2019-02-22 DIAGNOSIS — Y939 Activity, unspecified: Secondary | ICD-10-CM | POA: Insufficient documentation

## 2019-02-22 DIAGNOSIS — W2209XA Striking against other stationary object, initial encounter: Secondary | ICD-10-CM | POA: Insufficient documentation

## 2019-02-22 DIAGNOSIS — Z79899 Other long term (current) drug therapy: Secondary | ICD-10-CM | POA: Diagnosis not present

## 2019-02-22 DIAGNOSIS — Y929 Unspecified place or not applicable: Secondary | ICD-10-CM | POA: Diagnosis not present

## 2019-02-22 DIAGNOSIS — Y999 Unspecified external cause status: Secondary | ICD-10-CM | POA: Insufficient documentation

## 2019-02-22 DIAGNOSIS — S0990XA Unspecified injury of head, initial encounter: Secondary | ICD-10-CM | POA: Diagnosis present

## 2019-02-22 DIAGNOSIS — F17200 Nicotine dependence, unspecified, uncomplicated: Secondary | ICD-10-CM | POA: Insufficient documentation

## 2019-02-22 MED ORDER — NAPROXEN 500 MG PO TABS: 500.0000 mg | ORAL_TABLET | Freq: Two times a day (BID) | ORAL | 0 refills | Status: DC

## 2019-02-22 MED ORDER — ONDANSETRON 4 MG PO TBDP: 4.0000 mg | ORAL_TABLET | Freq: Three times a day (TID) | ORAL | 0 refills | Status: DC | PRN

## 2019-02-22 NOTE — ED Triage Notes (Signed)
Patient reports falling on 12/26 and striking her head on the trunk of her care. Patient reports nausea and dizziness since fall. C/O pain on the opposite side of her head. No LOC, no blood thinners.

## 2019-02-22 NOTE — Discharge Instructions (Signed)
Please read the attached instructions regarding concussions.  You may find that your symptoms last for another couple of weeks but should gradually improve.  If you should develop increasing vomiting, seizures, numbness, weakness, changes in vision or speech come back to the hospital immediately.  Otherwise follow-up with your doctor within 2 weeks.

## 2019-02-22 NOTE — ED Provider Notes (Signed)
[facility] EMERGENCY DEPARTMENT Provider Note   CSN: [id number] Arrival date & time: 02/22/19  1809     History Chief Complaint  Patient presents with  . Fall    Anna Shepherd is a 43 y.o. female.  HPI   This patient is a 43 year old female, she has a known history of posttraumatic stress disorder and anxiety disorder.  She presents after approximately 4 days ago striking her head on the edge of her trunk on the corner.  She reports that she felt like her eyes were going black at the time but then she got better.  Since that time she has had headaches, nausea, feeling dizzy especially when she gets up but has been able to drive and function and continues to go to work.  She has not vomiting, she has no active changes in her vision at this time.  She has not been taking anything for her headache.  She does not take any blood thinners.  She denies neck pain or any difficulty using her arms or her legs.  She does not have a bruise on her head from the injury.  She denies any prior concussions.  History reviewed. No pertinent past medical history.  Patient Active Problem List   Diagnosis Date Noted  . PTSD (post-traumatic stress disorder) 01/12/2017  . Generalized anxiety disorder 08/19/2016    Past Surgical History:  Procedure Laterality Date  . APPENDECTOMY    . CESAREAN SECTION       OB History    Gravida  1   Para      Term      Preterm      AB  1   Living  1     SAB  1   TAB      Ectopic      Multiple      Live Births              Family History  Problem Relation Age of Onset  . Depression Mother   . Depression Sister     Social History   Tobacco Use  . Smoking status: Current Every Day Smoker    Packs/day: 1.00  . Smokeless tobacco: Never Used  Substance Use Topics  . Alcohol use: No    Comment: 09-23-2016 PER PT NO  . Drug use: No    Comment: 09-23-2016 PER PT NO     Home Medications Prior to Admission medications   Medication Sig  Start Date End Date Taking? Authorizing Provider  celecoxib (CELEBREX) 200 MG capsule Take 200 mg by mouth daily. 07/24/16   [provider]  DULoxetine (CYMBALTA) 60 MG capsule Take 2 capsules (120 mg total) by mouth daily. 11/28/18 03/28/19  Neysa Hotter, MD  LORazepam (ATIVAN) 0.5 MG tablet Take 1 tablet (0.5 mg total) by mouth daily as needed for anxiety. 11/28/18   Neysa Hotter, MD  naproxen (NAPROSYN) 500 MG tablet Take 1 tablet (500 mg total) by mouth 2 (two) times daily with a meal. 02/22/19   Eber Hong, MD  omeprazole (PRILOSEC) 20 MG capsule Take 20 mg by mouth daily. 07/24/16   [provider]  ondansetron (ZOFRAN ODT) 4 MG disintegrating tablet Take 1 tablet (4 mg total) by mouth every 8 (eight) hours as needed for nausea. 02/22/19   Eber Hong, MD  propranolol (INDERAL) 20 MG tablet Take 20 mg by mouth 2 (two) times daily. 07/24/16   [provider]    Allergies  Azithromycin  Review of Systems   Review of Systems  All other systems reviewed and are negative.   Physical Exam Updated Vital Signs BP 114/78 (BP Location: Right Arm)   Pulse (!) 105   Temp 97.9 F (36.6 C) (Oral)   Resp 20   Ht 1.702 m (5\' 7" )   Wt 81.6 kg   LMP 02/21/2019 (Exact Date)   SpO2 100%   BMI 28.19 kg/m   Physical Exam Vitals and nursing note reviewed.  Constitutional:      General: She is not in acute distress.    Appearance: She is well-developed.  HENT:     Head: Normocephalic and atraumatic.     Comments: There is no signs of bruising or swelling around the face or the bilateral temples or the scalp.    Mouth/Throat:     Pharynx: No oropharyngeal exudate.     Comments: No malocclusion Eyes:     General: No scleral icterus.       Right eye: No discharge.        Left eye: No discharge.     Conjunctiva/sclera: Conjunctivae normal.     Pupils: Pupils are equal, round, and reactive to light.  Neck:     Thyroid: No thyromegaly.     Vascular: No JVD.   Cardiovascular:     Rate and Rhythm: Normal rate and regular rhythm.     Heart sounds: Normal heart sounds. No murmur. No friction rub. No gallop.   Pulmonary:     Effort: Pulmonary effort is normal. No respiratory distress.     Breath sounds: Normal breath sounds. No wheezing or rales.  Abdominal:     General: Bowel sounds are normal. There is no distension.     Palpations: Abdomen is soft. There is no mass.     Tenderness: There is no abdominal tenderness.  Musculoskeletal:        General: No tenderness. Normal range of motion.     Cervical back: Normal range of motion and neck supple.  Lymphadenopathy:     Cervical: No cervical adenopathy.  Skin:    General: Skin is warm and dry.     Findings: No erythema or rash.  Neurological:     Mental Status: She is alert.     Coordination: Coordination normal.     Comments: The patient has normal speech and coordination, normal gait, normal finger-nose-finger, no pronator drift, normal strength in all 4 extremities, normal sensation to light touch in all 4 extremities.  Cranial nerves III through XII are normal, memory is intact  Psychiatric:        Behavior: Behavior normal.     ED Results / Procedures / Treatments   Labs (all labs ordered are listed, but only abnormal results are displayed) Labs Reviewed - No data to display  EKG None  Radiology No results found.  Procedures Procedures (including critical care time)  Medications Ordered in ED Medications - No data to display  ED Course  I have reviewed the triage vital signs and the nursing notes.  Pertinent labs & imaging results that were available during my care of the patient were reviewed by me and considered in my medical decision making (see chart for details).    MDM Rules/Calculators/A&P                       The patient appears concussed, she otherwise appears well with normal vital signs, she is normotensive, has no neurologic  findings whatsoever and is  cleared to go home without any need for imaging.  I have recommended that the patient come back should she develop worsening symptoms but at this time I think a CT scan is not needed especially since she is 4 days out with a normal neurologic exam and no objective findings.  She is concussed, she will be given a combination of both naproxen and Zofran as needed for symptoms.  I recommended that she stay to work for a couple of days but she is insistent upon going back to work.  She is aware of what reasons to come back for.  Final Clinical Impression(s) / ED Diagnoses Final diagnoses:  Concussion without loss of consciousness, initial encounter    Rx / DC Orders ED Discharge Orders         Ordered    ondansetron (ZOFRAN ODT) 4 MG disintegrating tablet  Every 8 hours PRN     02/22/19 1906    naproxen (NAPROSYN) 500 MG tablet  2 times daily with meals     02/22/19 1906           Anna Chapel, MD 02/22/19 1909

## 2019-03-27 ENCOUNTER — Ambulatory Visit: Payer: Medicaid Other | Admitting: Psychiatry
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
## 2019-04-13 ENCOUNTER — Other Ambulatory Visit: Payer: Self-pay

## 2019-04-13 ENCOUNTER — Ambulatory Visit: Payer: Medicaid Other | Admitting: Psychiatry

## 2019-04-13 ENCOUNTER — Encounter (HOSPITAL_COMMUNITY): Payer: Self-pay | Admitting: Psychiatry

## 2019-04-13 ENCOUNTER — Ambulatory Visit (INDEPENDENT_AMBULATORY_CARE_PROVIDER_SITE_OTHER): Payer: Medicaid Other | Admitting: Psychiatry

## 2019-04-13 DIAGNOSIS — F411 Generalized anxiety disorder: Secondary | ICD-10-CM | POA: Diagnosis not present

## 2019-04-13 DIAGNOSIS — F4310 Post-traumatic stress disorder, unspecified: Secondary | ICD-10-CM | POA: Diagnosis not present

## 2019-04-13 MED ORDER — DULOXETINE HCL 60 MG PO CPEP: 120.0000 mg | ORAL_CAPSULE | Freq: Every day | ORAL | 3 refills | Status: DC

## 2019-05-09 DIAGNOSIS — Z23 Encounter for immunization: Secondary | ICD-10-CM | POA: Diagnosis not present
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
## 2019-07-31 ENCOUNTER — Ambulatory Visit (INDEPENDENT_AMBULATORY_CARE_PROVIDER_SITE_OTHER): Payer: Medicaid Other | Admitting: Psychiatry

## 2019-07-31 ENCOUNTER — Other Ambulatory Visit: Payer: Self-pay

## 2019-07-31 ENCOUNTER — Encounter (HOSPITAL_COMMUNITY): Payer: Self-pay | Admitting: Psychiatry

## 2019-07-31 DIAGNOSIS — F4310 Post-traumatic stress disorder, unspecified: Secondary | ICD-10-CM | POA: Diagnosis not present

## 2019-07-31 DIAGNOSIS — F411 Generalized anxiety disorder: Secondary | ICD-10-CM | POA: Diagnosis not present

## 2019-07-31 MED ORDER — DULOXETINE HCL 60 MG PO CPEP: 120.0000 mg | ORAL_CAPSULE | Freq: Every day | ORAL | 1 refills | Status: AC

## 2019-07-31 MED ORDER — LORAZEPAM 0.5 MG PO TABS: 0.5000 mg | ORAL_TABLET | Freq: Every day | ORAL | 0 refills | Status: DC | PRN

## 2020-06-28 ENCOUNTER — Other Ambulatory Visit: Payer: Self-pay

## 2020-06-28 ENCOUNTER — Ambulatory Visit (HOSPITAL_COMMUNITY)
Admission: RE | Admit: 2020-06-28 | Discharge: 2020-06-28 | Disposition: A | Discharge status: Home / Self Care | Payer: Medicaid Other | Source: Ambulatory Visit | Attending: Family Medicine | Admitting: Family Medicine

## 2020-06-28 ENCOUNTER — Other Ambulatory Visit (HOSPITAL_COMMUNITY): Payer: Self-pay | Admitting: Family Medicine

## 2020-06-28 DIAGNOSIS — M542 Cervicalgia: Secondary | ICD-10-CM | POA: Insufficient documentation

## 2020-06-28 DIAGNOSIS — M546 Pain in thoracic spine: Secondary | ICD-10-CM

## 2020-07-15 ENCOUNTER — Other Ambulatory Visit (HOSPITAL_COMMUNITY): Payer: Self-pay | Admitting: Family Medicine

## 2020-07-15 DIAGNOSIS — Z780 Asymptomatic menopausal state: Secondary | ICD-10-CM

## 2020-07-23 ENCOUNTER — Other Ambulatory Visit (HOSPITAL_COMMUNITY): Payer: Medicaid Other

## 2020-07-24 ENCOUNTER — Ambulatory Visit (HOSPITAL_COMMUNITY)
Admission: RE | Admit: 2020-07-24 | Discharge: 2020-07-24 | Disposition: A | Discharge status: Home / Self Care | Payer: Medicaid Other | Source: Ambulatory Visit | Attending: Family Medicine | Admitting: Family Medicine

## 2020-07-24 ENCOUNTER — Other Ambulatory Visit (HOSPITAL_COMMUNITY): Payer: Self-pay | Admitting: Family Medicine

## 2020-07-24 DIAGNOSIS — Z1231 Encounter for screening mammogram for malignant neoplasm of breast: Secondary | ICD-10-CM

## 2020-07-24 DIAGNOSIS — Z780 Asymptomatic menopausal state: Secondary | ICD-10-CM

## 2021-03-06 ENCOUNTER — Encounter (INDEPENDENT_AMBULATORY_CARE_PROVIDER_SITE_OTHER): Payer: Self-pay | Admitting: *Deleted

## 2021-05-01 ENCOUNTER — Ambulatory Visit (INDEPENDENT_AMBULATORY_CARE_PROVIDER_SITE_OTHER): Payer: Medicaid Other | Admitting: Gastroenterology

## 2021-05-15 ENCOUNTER — Ambulatory Visit (INDEPENDENT_AMBULATORY_CARE_PROVIDER_SITE_OTHER): Payer: Medicaid Other | Admitting: Gastroenterology

## 2021-06-16 ENCOUNTER — Ambulatory Visit (INDEPENDENT_AMBULATORY_CARE_PROVIDER_SITE_OTHER): Payer: Medicaid Other | Admitting: Gastroenterology

## 2021-06-26 ENCOUNTER — Ambulatory Visit (INDEPENDENT_AMBULATORY_CARE_PROVIDER_SITE_OTHER): Payer: Medicaid Other | Admitting: Gastroenterology

## 2021-06-26 ENCOUNTER — Encounter (INDEPENDENT_AMBULATORY_CARE_PROVIDER_SITE_OTHER): Payer: Self-pay | Admitting: Gastroenterology

## 2021-06-26 DIAGNOSIS — K581 Irritable bowel syndrome with constipation: Secondary | ICD-10-CM

## 2021-06-26 DIAGNOSIS — K589 Irritable bowel syndrome without diarrhea: Secondary | ICD-10-CM | POA: Insufficient documentation

## 2021-06-26 NOTE — Progress Notes (Signed)
Katrinka Blazing, M.D. ?Gastroenterology & Hepatology ?[facility] ?[address] ?[place], [place] [address] ?Primary Care Physician: ?Kara Pacer, NP ?[address] ?[place] [address] ? ?Referring MD: PCP ? ?Chief Complaint:  constipation ? ?History of Present Illness: ?Anna Shepherd is a 46 y.o. female with past medical history of IBS-C, GERD, anxiety, PTSD, who presents for evaluation of constipation. ? ?Patient reports she has presented constipation since she was in her twenties. She reports that she was presenting worsening constipation through the years, to the point she moves her bowels every 2 weeks. She has significant bloating due to this, along with significant discomfort in her whole abdomen. Patient has tried Amitiza in the past but did not feel any improvement. She is currently on Linzess 290 mcg every day which leads to a BM every week. Feels that it has led to passing more gas. She feels her symptoms get better after having a bowel movement. She has also tried Miralax and Dulcolax, as well as OTC suppositories.  ? ?States when she is able to have a bowel movement her stool is usually very hard initially after straining significantly, which is followed by loose BM and finally watery BM. ? ?No vaginal deliveries in the past. ? ?Most recent TSH 2019 was 0.72. No recent CMP in our records. ? ?States for the last 5-6 years she has had GERD. She takes lansoprazole 30 mg qday which controls her heartburn. ? ?The patient denies having any nausea, vomiting, fever, chills, hematochezia, melena, hematemesis, diarrhea, jaundice, pruritus or weight loss. ? ?Last TZG:YFVCB ?Last Colonoscopy:never ? ?FHx: neg for any gastrointestinal/liver disease, father lung cancer ?Social: smokes 1 pack a day, neg alcohol or illicit drug use ?Surgical: appendectomy, C-section ? ?Past Medical History:No past medical history on file. ? ?Past Surgical  History: ?Past Surgical History:  ?Procedure Laterality Date  ? APPENDECTOMY    ? CESAREAN SECTION    ? ? ?Family History: ?Family History  ?Problem Relation Age of Onset  ? Depression Mother   ? Depression Sister   ? ? ?Social History: ?Social History  ? ?Tobacco Use  ?Smoking Status Every Day  ? Packs/day: 1.00  ? Types: Cigarettes  ? Passive exposure: Current  ?Smokeless Tobacco Never  ? ?Social History  ? ?Substance and Sexual Activity  ?Alcohol Use No  ? Comment: 09-23-2016 PER PT NO  ? ?Social History  ? ?Substance and Sexual Activity  ?Drug Use No  ? Comment: 09-23-2016 PER PT NO   ? ? ?Allergies: ?Allergies  ?Allergen Reactions  ? Azithromycin Hives  ? ? ?Medications: ?Current Outpatient Medications  ?Medication Sig Dispense Refill  ? Aspirin-Salicylamide-Caffeine (BC HEADACHE POWDER PO) Take by mouth. Takes as needed    ? celecoxib (CELEBREX) 100 MG capsule Take by mouth 2 (two) times daily.  5  ? DULoxetine (CYMBALTA) 60 MG capsule Take 2 capsules (120 mg total) by mouth daily. 60 capsule 1  ? ibuprofen (ADVIL) 200 MG tablet Take 200 mg by mouth. Takes as needed    ? lansoprazole (PREVACID) 30 MG capsule Take 30 mg by mouth daily.    ? LINZESS 290 MCG CAPS capsule Take 290 mcg by mouth daily.    ? propranolol (INDERAL) 20 MG tablet Take 20 mg by mouth 2 (two) times daily.  12  ? ?No current facility-administered medications for this visit.  ? ? ?Review of Systems: ?GENERAL: negative for malaise, night sweats ?HEENT: No changes in  hearing or vision, no nose bleeds or other nasal problems. ?NECK: Negative for lumps, goiter, pain and significant neck swelling ?RESPIRATORY: Negative for cough, wheezing ?CARDIOVASCULAR: Negative for chest pain, leg swelling, palpitations, orthopnea ?GI: SEE HPI ?MUSCULOSKELETAL: Negative for joint pain or swelling, back pain, and muscle pain. ?SKIN: Negative for lesions, rash ?PSYCH: Negative for sleep disturbance, mood disorder and recent psychosocial stressors. ?HEMATOLOGY  Negative for prolonged bleeding, bruising easily, and swollen nodes. ?ENDOCRINE: Negative for cold or heat intolerance, polyuria, polydipsia and goiter. ?NEURO: negative for tremor, gait imbalance, syncope and seizures. ?The remainder of the review of systems is noncontributory. ? ? ?Physical Exam: ?BP 109/74 (BP Location: Right Arm, Patient Position: Sitting, Cuff Size: Large)   Pulse 86   Temp 97.6 ?F (36.4 ?C) (Oral)   Ht 5\' 7"  (1.702 m)   Wt 221 lb 4.8 oz (100.4 kg)   BMI 34.66 kg/m?  ?GENERAL: The patient is AO x3, in no acute distress. Obese. ?HEENT: Head is normocephalic and atraumatic. EOMI are intact. Mouth is well hydrated and without lesions. ?NECK: Supple. No masses ?LUNGS: Clear to auscultation. No presence of rhonchi/wheezing/rales. Adequate chest expansion ?HEART: RRR, normal s1 and s2. ?ABDOMEN: Soft, nontender, no guarding, no peritoneal signs, and nondistended. BS +. No masses. ?EXTREMITIES: Without any cyanosis, clubbing, rash, lesions or edema. ?NEUROLOGIC: AOx3, no focal motor deficit. ?SKIN: no jaundice, no rashes ? ? ?Imaging/Labs: ?as above ? ?I personally reviewed and interpreted the available labs, imaging and endoscopic files. ? ?Impression and Plan: ?Anna Shepherd is a 46 y.o. female with past medical history of IBS-C, GERD, anxiety, PTSD, who presents for evaluation of constipation.  The patient has presented chronic constipation for multiple years which has now responded very well to different over-the-counter laxatives and has been very mildly responded to high-dose Linzess.  We will need to rule out other reversible causes for constipation with blood work-up, but also we will need to explore any endoluminal etiologies with a colonoscopy.  If these investigations are negative, we will consider performing an anorectal manometry.  The patient agrees and understood the plan. ? ?For now, we will continue on the same dose of Linzess but will also MiraLAX on top of this, she can  uptitrate the dosage depending on symptoms. ? ?- Schedule colonoscopy ?- Check TSH, CMP and celiac disease panel ?- If negative workup, we will proceed with anorectal manometry ?- Continue Linzess 290 mcg qday ?- Start taking Miralax 1 capful every day for one week. If bowel movements do not improve, increase to 1 capful every 12 hours. If after two weeks there is no improvement, increase to 1 capful every 8 hours ? ?All questions were answered.     ? ?54, MD ?Gastroenterology and Hepatology ?[facility] ? ?

## 2021-06-26 NOTE — Patient Instructions (Addendum)
Schedule colonoscopy ?Perform blood workup ?If negative workup, we will proceed with anorectal manometry ?Continue Linzess 290 mcg qday ?Start taking Miralax 1 capful every day for one week. If bowel movements do not improve, increase to 1 capful every 12 hours. If after two weeks there is no improvement, increase to 1 capful every 8 hours ? ?

## 2021-06-27 LAB — COMPREHENSIVE METABOLIC PANEL
AG Ratio: 1.4 (calc) (ref 1.0–2.5)
ALT: 20 U/L (ref 6–29)
AST: 17 U/L (ref 10–35)
Albumin: 4 g/dL (ref 3.6–5.1)
Alkaline phosphatase (APISO): 51 U/L (ref 31–125)
BUN: 10 mg/dL (ref 7–25)
CO2: 27 mmol/L (ref 20–32)
Calcium: 8.8 mg/dL (ref 8.6–10.2)
Chloride: 106 mmol/L (ref 98–110)
Creat: 0.73 mg/dL (ref 0.50–0.99)
Globulin: 2.9 g/dL (calc) (ref 1.9–3.7)
Glucose, Bld: 78 mg/dL (ref 65–139)
Potassium: 4.2 mmol/L (ref 3.5–5.3)
Sodium: 139 mmol/L (ref 135–146)
Total Bilirubin: 0.3 mg/dL (ref 0.2–1.2)
Total Protein: 6.9 g/dL (ref 6.1–8.1)

## 2021-06-27 LAB — CELIAC DISEASE PANEL
(tTG) Ab, IgA: <1 U/mL
(tTG) Ab, IgG: <1 U/mL
Gliadin IgA: 3.6 U/mL
Gliadin IgG: <1 U/mL
Immunoglobulin A: 178 mg/dL (ref 47–310)

## 2021-06-27 LAB — TSH: TSH: 0.59 mIU/L

## 2021-07-02 ENCOUNTER — Other Ambulatory Visit (INDEPENDENT_AMBULATORY_CARE_PROVIDER_SITE_OTHER): Payer: Self-pay

## 2021-07-02 ENCOUNTER — Telehealth (INDEPENDENT_AMBULATORY_CARE_PROVIDER_SITE_OTHER): Payer: Self-pay

## 2021-07-02 ENCOUNTER — Encounter (INDEPENDENT_AMBULATORY_CARE_PROVIDER_SITE_OTHER): Payer: Self-pay

## 2021-07-02 DIAGNOSIS — Z01812 Encounter for preprocedural laboratory examination: Secondary | ICD-10-CM

## 2021-07-02 MED ORDER — SUTAB 1479-225-188 MG PO TABS: 1.0000 | ORAL_TABLET | Freq: Once | ORAL | 0 refills | Status: AC

## 2021-07-02 NOTE — Telephone Encounter (Signed)
Zayvien Canning Ann Preethi Scantlebury, CMA  ?

## 2021-08-06 ENCOUNTER — Ambulatory Visit (HOSPITAL_COMMUNITY): Admit: 2021-08-06 | Payer: Medicaid Other | Admitting: Gastroenterology

## 2021-08-06 ENCOUNTER — Encounter (HOSPITAL_COMMUNITY): Payer: Self-pay

## 2021-08-06 SURGERY — COLONOSCOPY WITH PROPOFOL
Anesthesia: Monitor Anesthesia Care

## 2021-08-22 ENCOUNTER — Other Ambulatory Visit (HOSPITAL_COMMUNITY): Payer: Self-pay | Admitting: Nurse Practitioner

## 2021-08-22 DIAGNOSIS — Z1231 Encounter for screening mammogram for malignant neoplasm of breast: Secondary | ICD-10-CM

## 2021-08-25 ENCOUNTER — Encounter (HOSPITAL_COMMUNITY): Payer: Self-pay

## 2021-08-25 ENCOUNTER — Ambulatory Visit (HOSPITAL_COMMUNITY)
Admission: RE | Admit: 2021-08-25 | Discharge: 2021-08-25 | Disposition: A | Discharge status: Home / Self Care | Payer: Medicaid Other | Source: Ambulatory Visit | Attending: Nurse Practitioner | Admitting: Nurse Practitioner

## 2021-08-25 DIAGNOSIS — Z1231 Encounter for screening mammogram for malignant neoplasm of breast: Secondary | ICD-10-CM | POA: Insufficient documentation

## 2021-09-03 ENCOUNTER — Inpatient Hospital Stay
Admission: RE | Admit: 2021-09-03 | Discharge: 2021-09-03 | Disposition: A | Discharge status: Home / Self Care | Payer: Self-pay | Source: Ambulatory Visit | Attending: Adult Health | Admitting: Adult Health

## 2021-09-03 ENCOUNTER — Other Ambulatory Visit (HOSPITAL_COMMUNITY): Payer: Self-pay | Admitting: Adult Health

## 2021-09-03 DIAGNOSIS — Z1231 Encounter for screening mammogram for malignant neoplasm of breast: Secondary | ICD-10-CM

## 2022-11-30 IMAGING — DX DG THORACIC SPINE 3V
3 series · 3 of 3 positions shown · non-contrast
Comparison: None.

CLINICAL DATA: Thoracic pain radiating to arm.

EXAM:
THORACIC SPINE - 3 VIEWS

[t-spine ap]
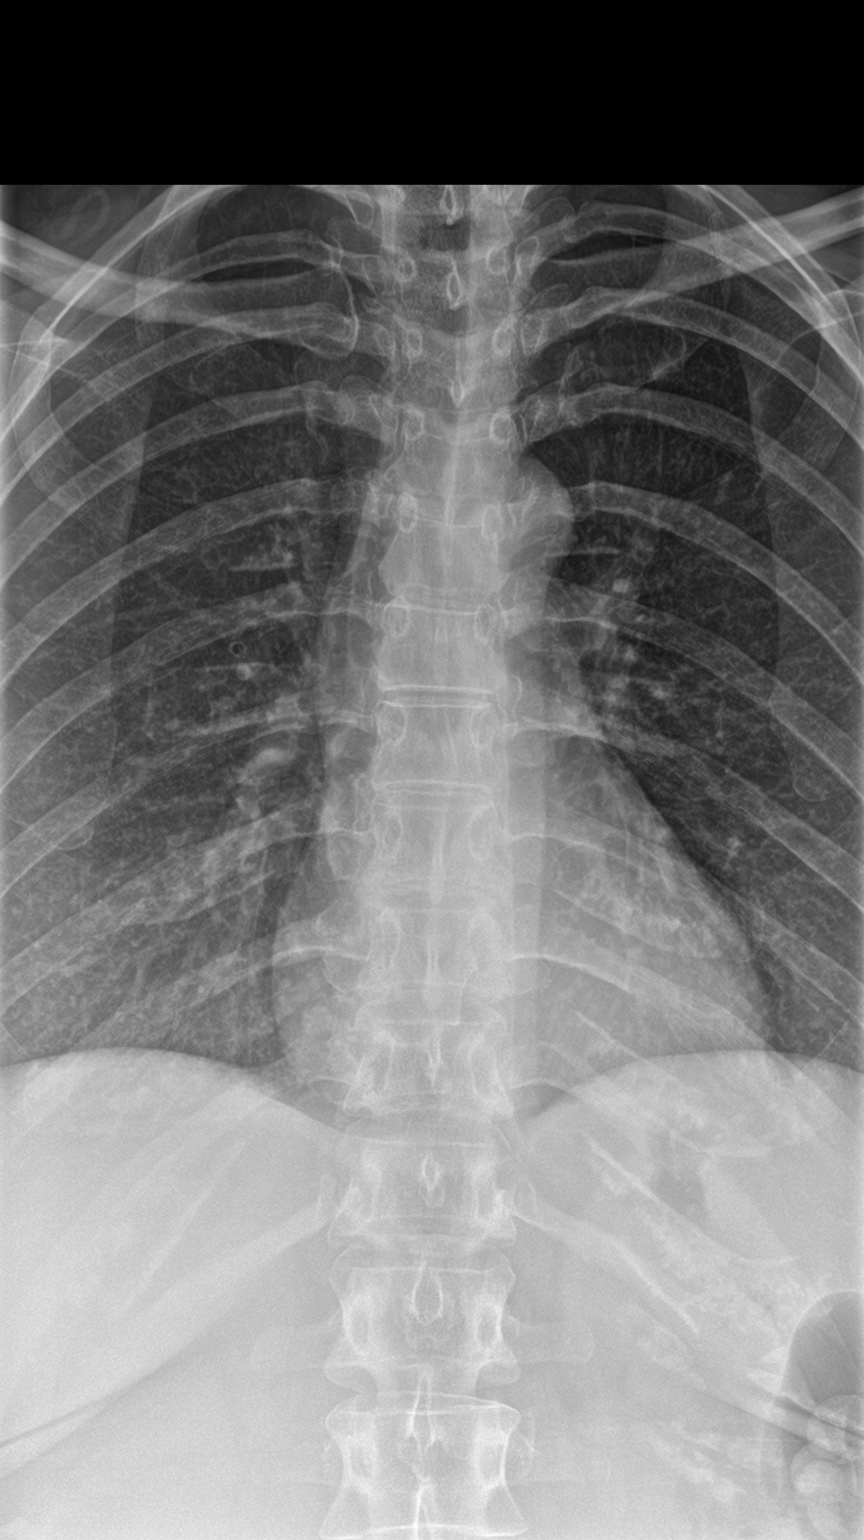

[t-spine lat]
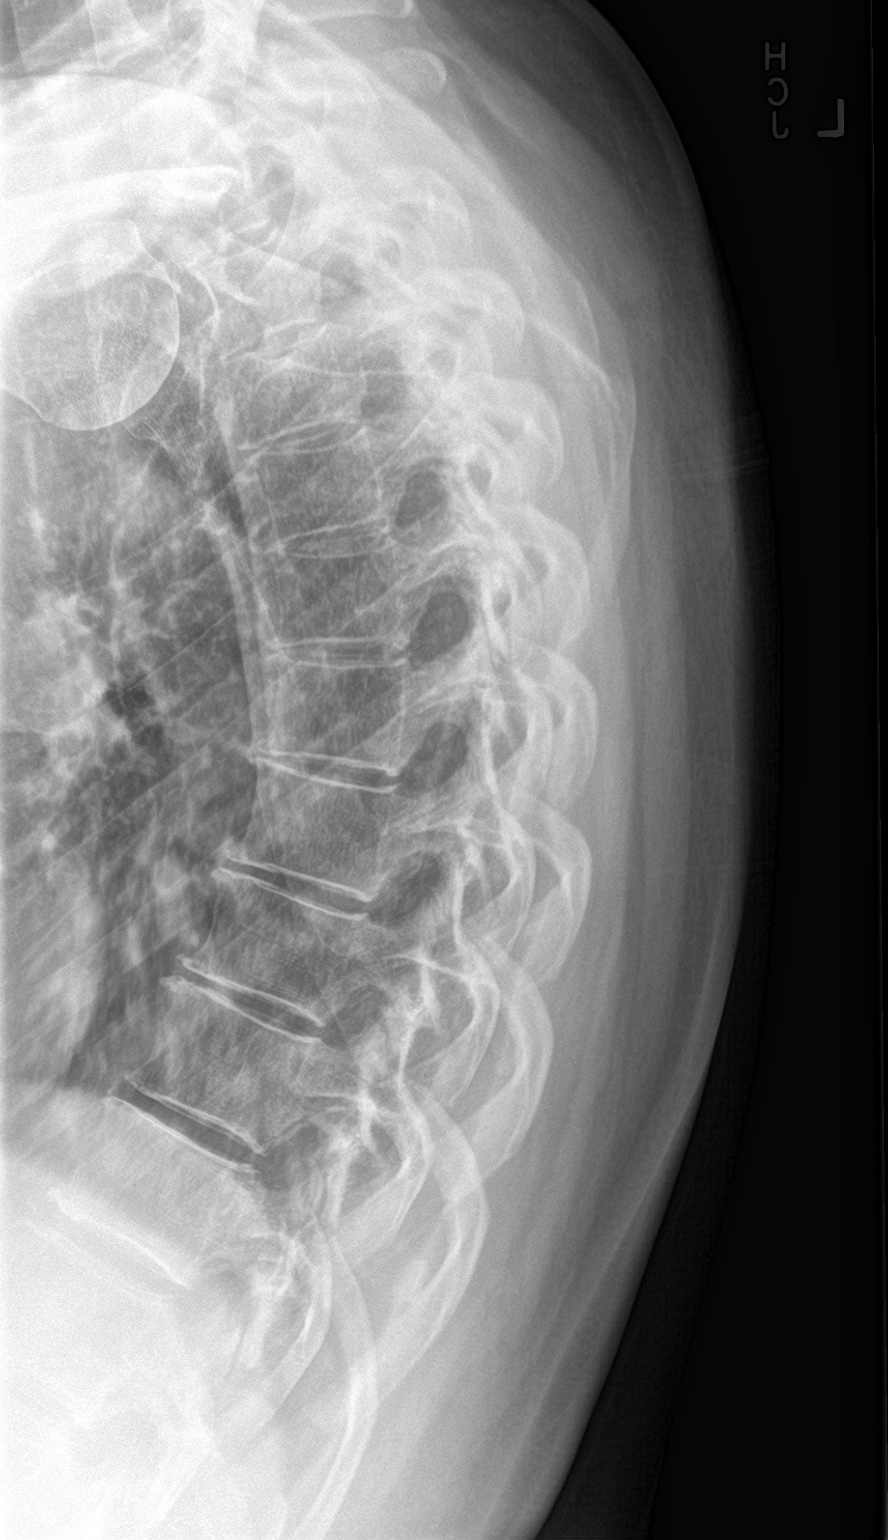

[t-spine swimmers]
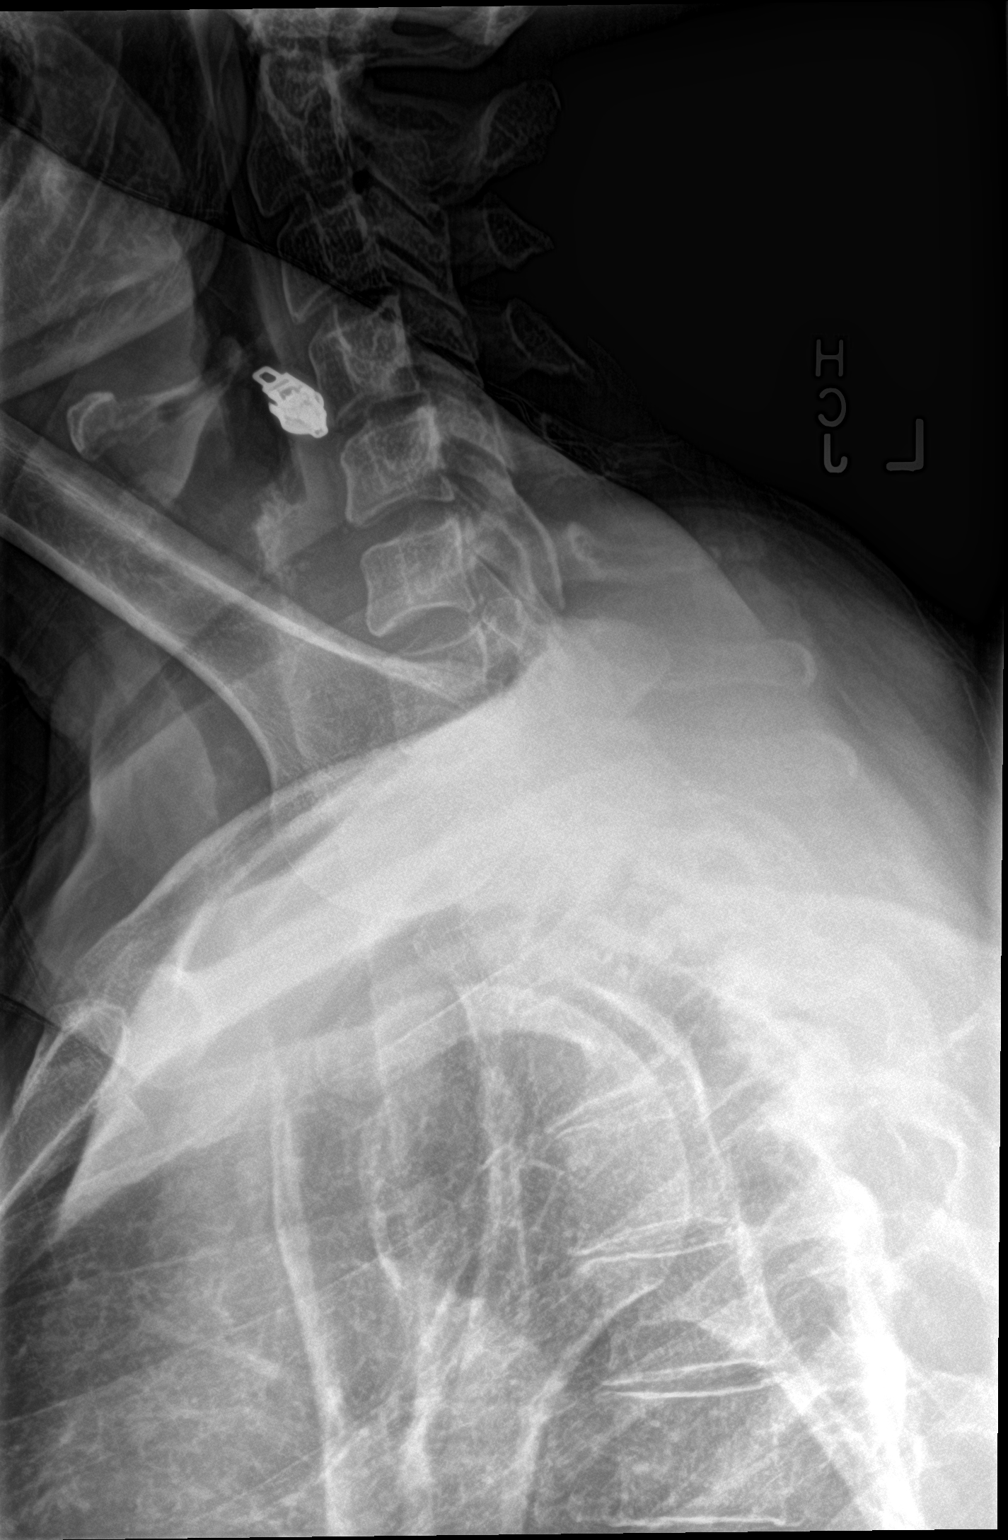

[3 of 3 positions shown; findings below may reference images not displayed]

FINDINGS: Vertebral body alignment, heights and disc space heights are normal.
Pedicles are intact. No compression fracture or subluxation. Minimal
spondylosis of the thoracic spine.
IMPRESSION: 1. No acute findings.
2. Minimal spondylosis of the thoracic spine.

## 2023-03-08 DIAGNOSIS — G43109 Migraine with aura, not intractable, without status migrainosus: Secondary | ICD-10-CM | POA: Insufficient documentation

## 2023-03-08 DIAGNOSIS — K219 Gastro-esophageal reflux disease without esophagitis: Secondary | ICD-10-CM | POA: Insufficient documentation

## 2023-03-08 DIAGNOSIS — F5101 Primary insomnia: Secondary | ICD-10-CM | POA: Insufficient documentation

## 2023-06-23 ENCOUNTER — Ambulatory Visit: Payer: BC Managed Care – PPO | Admitting: Neurology

## 2023-06-23 ENCOUNTER — Encounter: Payer: Self-pay | Admitting: Neurology

## 2023-06-23 ENCOUNTER — Telehealth: Payer: Self-pay | Admitting: *Deleted

## 2023-06-23 VITALS — BP 105/70 | HR 70 | Ht 67.0 in | Wt 236.8 lb

## 2023-06-23 DIAGNOSIS — R519 Headache, unspecified: Secondary | ICD-10-CM | POA: Diagnosis not present

## 2023-06-23 DIAGNOSIS — H539 Unspecified visual disturbance: Secondary | ICD-10-CM | POA: Diagnosis not present

## 2023-06-23 DIAGNOSIS — R51 Headache with orthostatic component, not elsewhere classified: Secondary | ICD-10-CM

## 2023-06-23 DIAGNOSIS — H9193 Unspecified hearing loss, bilateral: Secondary | ICD-10-CM

## 2023-06-23 DIAGNOSIS — H93A3 Pulsatile tinnitus, bilateral: Secondary | ICD-10-CM | POA: Diagnosis not present

## 2023-06-23 MED ORDER — NURTEC 75 MG PO TBDP: 75.0000 mg | ORAL_TABLET | Freq: Every day | ORAL | 0 refills | Status: AC

## 2023-06-23 NOTE — Patient Instructions (Addendum)
 Lumbar puncture MRI of the brain w/wo contrast Differential: Status migrainosus or IDIOPATHIC INTRACRANIAL HYPERTENSION  Take Nurtec the next 1-2 weeks daily  Idiopathic Intracranial Hypertension  Idiopathic intracranial hypertension (IIH) is a condition that increases pressure around the brain. The fluid that surrounds the brain and spinal cord (cerebrospinal fluid, or CSF) increases and causes the pressure. Idiopathic means that the cause of this condition is not known. IIH affects the brain and spinal cord. If this condition is not treated, it can cause vision loss or blindness. What are the causes? The cause of this condition is not known. What increases the risk? The following factors may make you more likely to develop this condition: Being obese. Being a person who is female, between the ages of 3 and 77 years old, and who has not gone through menopause. Taking certain medicines, such as birth control, acne medicines, or steroids. What are the signs or symptoms? Symptoms of this condition include: Headaches. This is the most common symptom. Brief periods of total blindness. Double vision, blurred vision, or poor side (peripheral) vision. Pain in the shoulders or neck. Nausea and vomiting. A sound like rushing water or a pulsing sound within the ears (pulsatile tinnitus), or ringing in the ears. How is this diagnosed? This condition may be diagnosed based on: Your symptoms and medical history. Imaging tests of the brain, such as: CT scan. MRI. Magnetic resonance venogram (MRV) to check the veins. Diagnostic lumbar puncture. This is a procedure to remove and examine a sample of CSF. This procedure can determine whether your fluid pressure is too high. An eye exam to check for swelling or nerve damage in the eyes. How is this treated? Treatment for this condition depends on the symptoms. The goal of treatment is to decrease the pressure around your brain. Common treatments  include: Weight loss through healthy eating, salt restriction, and exercise, if you are overweight. Medicines to decrease the production of CSF and lower the pressure within your skull. Medicines to prevent or treat headaches. Other treatments may include: Surgery to place drains (shunts) in your brain to remove extra fluid. Lumbar puncture to remove extra CSF. Follow these instructions at home: If you are overweight or obese, work with your health care provider to lose weight. Take over-the-counter and prescription medicines only as told by your health care provider. Ask your health care provider if the medicine prescribed to you requires you to avoid driving or using machinery. Do not use any products that contain nicotine or tobacco. These products include cigarettes, chewing tobacco, and vaping devices, such as e-cigarettes. If you need help quitting, ask your health care provider. Keep all follow-up visits. Your health care provider will need to monitor you regularly. Contact a health care provider if: You have changes in your vision, such as: Double vision. Blurred vision. Poor peripheral vision. Get help right away if: You have any of the following symptoms and they get worse or do not get better: Headaches. Nausea. Vomiting. Sudden trouble seeing. This information is not intended to replace advice given to you by your health care provider. Make sure you discuss any questions you have with your health care provider. Rimegepant Disintegrating Tablets What is this medication? RIMEGEPANT (ri ME je pant) prevents and treats migraines. It works by blocking a substance in the body that causes migraines. This medicine may be used for other purposes; ask your health care provider or pharmacist if you have questions. COMMON BRAND NAME(S): NURTEC ODT What should I tell  my care team before I take this medication? They need to know if you have any of these conditions: Kidney disease Liver  disease An unusual or allergic reaction to rimegepant, other medications, foods, dyes, or preservatives Pregnant or trying to get pregnant Breast-feeding How should I use this medication? Take this medication by mouth. Take it as directed on the prescription label. Leave the tablet in the sealed pack until you are ready to take it. With dry hands, open the pack and gently remove the tablet. If the tablet breaks or crumbles, throw it away. Use a new tablet. Place the tablet in the mouth and allow it to dissolve. Then, swallow it. Do not cut, crush, or chew this medication. You do not need water to take this medication. Talk to your care team about the use of this medication in children. Special care may be needed. Overdosage: If you think you have taken too much of this medicine contact a poison control center or emergency room at once. NOTE: This medicine is only for you. Do not share this medicine with others. What if I miss a dose? This does not apply. This medication is not for regular use. What may interact with this medication? Certain medications for fungal infections, such as fluconazole, itraconazole Rifampin This list may not describe all possible interactions. Give your health care provider a list of all the medicines, herbs, non-prescription drugs, or dietary supplements you use. Also tell them if you smoke, drink alcohol, or use illegal drugs. Some items may interact with your medicine. What should I watch for while using this medication? Visit your care team for regular checks on your progress. Tell your care team if your symptoms do not start to get better or if they get worse. What side effects may I notice from receiving this medication? Side effects that you should report to your care team as soon as possible: Allergic reactions--skin rash, itching, hives, swelling of the face, lips, tongue, or throat Side effects that usually do not require medical attention (report to your care  team if they continue or are bothersome): Nausea Stomach pain This list may not describe all possible side effects. Call your doctor for medical advice about side effects. You may report side effects to FDA at 1-800-FDA-1088. Where should I keep my medication? Keep out of the reach of children and pets. Store at room temperature between 20 and 25 degrees C (68 and 77 degrees F). Get rid of any unused medication after the expiration date. To get rid of medications that are no longer needed or have expired: Take the medication to a medication take-back program. Check with your pharmacy or law enforcement to find a location. If you cannot return the medication, check the label or package insert to see if the medication should be thrown out in the garbage or flushed down the toilet. If you are not sure, ask your care team. If it is safe to put it in the trash, take the medication out of the container. Mix the medication with cat litter, dirt, coffee grounds, or other unwanted substance. Seal the mixture in a bag or container. Put it in the trash. NOTE: This sheet is a summary. It may not cover all possible information. If you have questions about this medicine, talk to your doctor, pharmacist, or health care provider.  2024 Elsevier/Gold Standard (2021-04-02 00:00:00) Document Revised: 07/08/2021 Document Reviewed: 06/17/2021 Elsevier Patient Education  2024 [employer].

## 2023-06-23 NOTE — Progress Notes (Signed)
 [facility]    Provider:  Dr Tresia Fruit Requesting Provider: Sheralyn Dies, FNP Primary Care Provider:  Patient, No Pcp Per  CC:  migraines  HPI:  Anna Shepherd is a 48 y.o. female here as requested by Sheralyn Dies, FNP for migraines. has Generalized anxiety disorder; PTSD (post-traumatic stress disorder); IBS (irritable bowel syndrome); Gastroesophageal reflux disease without esophagitis; Migraine with aura and without status migrainosus, not intractable; and Primary insomnia on their problem list.  10/01/2022 CT head for: 10/01/2022 CT head CLINICAL DATA:  other speech disturbances Dizziness, headaches,  memory loss and slurred speech x 4 months per pt. Denies trauma or  injury. Denies hx of strokes, seizures, surg. Was negative.   She has a constant headache. Started 8 months ago. Different that prior headache, this is why she had the CT in 09/2022 so possibly longer than 8 months. She wok eup with the headache one morning and has not gone away. She has taken BCs, excedrin, anything she can get. It can be in different places, can be in the temples or n top of the head on the right side, in the temples bilaterally, it just aches, can be throbbing and can feel her heartbeat, she has sound sensitivity, had nausea in the past less so now, no significant light sensitivity. She wakes up with the head and then goes to sleep with it, worse at the end of the day, hasn;t been positional such as worse when standing or better laying down and never has been, but she does say as soon as she get up she takes something because she know throughout the day it;; get worse, she got new glasses, denies any vision changes, if she gets up too fast she will get dizzy. Nothing helps, some meds can take the edge off of it but never completely goes away. More pressure. No recent weight gain. She hs an IUD has had it 5 years, she was placed on a round of steroids and that did not help at all. Feels like  her ears are full. Can be pressure around the head and goes into her jaw and the teet even hurt. She tried a round of antibiotics for sinus infection.   Medications tried that can be used in migraine/headache management greater than 3 months include: Lifestyle modification, headache diaries, better sleep hygiene, exercise, management of migraine triggers, various analgesics/nsaids, fioricet, cymbalta , gabapentin, lamictal, propranolol, maxalt, trazodone (current),   Reviewed notes, labs and imaging from outside physicians, which showed:   10/01/2022 CT head CLINICAL DATA:  other speech disturbances Dizziness, headaches,  memory loss and slurred speech x 4 months per pt. Denies trauma or  injury. Denies hx of strokes, seizures, surg.   EXAM:  CT HEAD WITHOUT CONTRAST   TECHNIQUE:  Contiguous axial images were obtained from the base of the skull  through the vertex without intravenous contrast.   RADIATION DOSE REDUCTION: This exam was performed according to the  departmental dose-optimization program which includes automated  exposure control, adjustment of the mA and/or kV according to  patient size and/or use of iterative reconstruction technique.   COMPARISON:  None Available.   FINDINGS:  Brain: No evidence of acute infarction, hemorrhage, hydrocephalus,  extra-axial collection or mass lesion/mass effect. Bilateral basal  ganglia calcifications.   Vascular: No hyperdense vessel or unexpected calcification.   Skull: Normal. Negative for fracture or focal lesion.   FINDINGS:  Brain: No evidence of acute infarction, hemorrhage, hydrocephalus,  extra-axial collection or mass lesion/mass  effect. Bilateral basal  ganglia calcifications.   Vascular: No hyperdense vessel or unexpected calcification.   Skull: Normal. Negative for fracture or focal lesion.   Sinuses/Orbits: No acute finding.   CLINICAL DATA:  Neck and thoracic pain with radicular symptoms to arm.    EXAM: CERVICAL SPINE - COMPLETE 4+ VIEW   COMPARISON:  None.   FINDINGS: Vertebral body alignment, heights and disc space heights are normal. Prevertebral soft tissues are normal. No significant neural foraminal narrowing. Atlantoaxial articulation is normal. Minimal uncovertebral joint spurring.   IMPRESSION: No acute findings.     Electronically Signed   By: Roda Cirri M.D.   On: 07/01/2020 15:53     Latest Ref Rng & Units 06/26/2021    3:50 PM 06/26/2009    5:02 AM 06/24/2009    5:44 AM  CMP  Glucose 65 - 139 mg/dL 78   161   BUN 7 - 25 mg/dL 10   4   Creatinine 0.96 - 0.99 mg/dL 0.45   4.09   Sodium 811 - 146 mmol/L 139   134   Potassium 3.5 - 5.3 mmol/L 4.2  4.0 DELTA CHECK NOTED  3.1   Chloride 98 - 110 mmol/L 106   106   CO2 20 - 32 mmol/L 27   24   Calcium 8.6 - 10.2 mg/dL 8.8   7.6   Total Protein 6.1 - 8.1 g/dL 6.9     Total Bilirubin 0.2 - 1.2 mg/dL 0.3     AST 10 - 35 U/L 17     ALT 6 - 29 U/L 20         Latest Ref Rng & Units 06/24/2009    5:44 AM 06/23/2009    6:12 AM 06/22/2009    3:25 PM  CBC  WBC 4.0 - 10.5 K/uL 11.9  20.4  18.6   Hemoglobin 12.0 - 15.0 g/dL 91.4  78.2  95.6   Hematocrit 36.0 - 46.0 % 32.0  34.0  39.5   Platelets 150 - 400 K/uL 146  162  186      Review of Systems: Patient complains of symptoms per HPI as well as the following symptoms none. Pertinent negatives and positives per HPI. All others negative.   Social History   Socioeconomic History   Marital status: Divorced    Spouse name: Not on file   Number of children: Not on file   Years of education: Not on file   Highest education level: Not on file  Occupational History   Not on file  Tobacco Use   Smoking status: Every Day    Current packs/day: 1.00    Types: Cigarettes    Passive exposure: Current   Smokeless tobacco: Never  Vaping Use   Vaping status: Never Used  Substance and Sexual Activity   Alcohol use: No    Comment: 09-23-2016 PER PT NO   Drug use: No     Comment: 09-23-2016 PER PT NO    Sexual activity: Not on file  Other Topics Concern   Not on file  Social History Narrative   Not on file   Social Drivers of Health   Financial Resource Strain: Low Risk  (03/08/2023)   Received from [facility]   Overall Financial Resource Strain (CARDIA)    Difficulty of Paying Living Expenses: Not very hard  Food Insecurity: No Food Insecurity (03/08/2023)   Received from [facility]   Hunger Vital Sign    Worried About  Running Out of Food in the Last Year: Never true    Ran Out of Food in the Last Year: Never true  Transportation Needs: No Transportation Needs (03/08/2023)   Received from [facility] - Transportation    Lack of Transportation (Medical): No    Lack of Transportation (Non-Medical): No  Physical Activity: Inactive (03/08/2023)   Received from [facility]   Exercise Vital Sign    Days of Exercise per Week: 0 days    Minutes of Exercise per Session: 0 min  Stress: Stress Concern Present (03/08/2023)   Received from [facility] - Occupational Stress Questionnaire    Feeling of Stress : Rather much  Social Connections: Socially Isolated (03/08/2023)   Received from [facility]   Social Connection and Isolation Panel [NHANES]    Frequency of Communication with Friends and Family: More than three times a week    Frequency of Social Gatherings with Friends and Family: More than three times a week    Attends Religious Services: Never    Database administrator or Organizations: No    Attends Banker Meetings: Never    Marital Status: Divorced  Catering manager Violence: Not At Risk (03/08/2023)   Received from [facility]   Humiliation, Afraid, Rape, and Kick questionnaire    Fear of Current or Ex-Partner: No    Emotionally Abused: No    Physically Abused: No    Sexually Abused: No    Family History  Problem Relation Age of Onset    Depression Mother    Migraines Mother    Depression Sister    Breast cancer Paternal Grandmother     Past Medical History:  Diagnosis Date   Headache     Patient Active Problem List   Diagnosis Date Noted   Gastroesophageal reflux disease without esophagitis 03/08/2023   Migraine with aura and without status migrainosus, not intractable 03/08/2023   Primary insomnia 03/08/2023   IBS (irritable bowel syndrome) 06/26/2021   PTSD (post-traumatic stress disorder) 01/12/2017   Generalized anxiety disorder 08/19/2016    Past Surgical History:  Procedure Laterality Date   APPENDECTOMY     CESAREAN SECTION      Current Outpatient Medications  Medication Sig Dispense Refill   Aspirin-Salicylamide-Caffeine (BC HEADACHE POWDER PO) Take by mouth. Takes as needed     butalbital-acetaminophen-caffeine (FIORICET) 50-325-40 MG tablet Take 1 tablet by mouth.     celecoxib (CELEBREX) 100 MG capsule Take by mouth 2 (two) times daily.  5   esomeprazole (NEXIUM) 20 MG capsule Take 20 mg by mouth.     estradiol (VIVELLE-DOT) 0.05 MG/24HR patch Place 1 patch onto the skin.     fluticasone (FLONASE) 50 MCG/ACT nasal spray 1 spray.     gabapentin (NEURONTIN) 300 MG capsule Take 300 mg by mouth.     ibuprofen (ADVIL) 200 MG tablet Take 200 mg by mouth. Takes as needed     levonorgestrel (MIRENA) 20 MCG/DAY IUD 1 each by Intrauterine route.     metFORMIN (GLUCOPHAGE-XR) 500 MG 24 hr tablet Take 500 mg by mouth.     propranolol (INDERAL) 20 MG tablet Take 60 mg by mouth 2 (two) times daily.  12   Rimegepant Sulfate (NURTEC) 75 MG TBDP Take 1 tablet (75 mg total) by mouth daily. For migraines. Take as close to onset of migraine as possible. One daily maximum. 14 tablet 0  rizatriptan (MAXALT) 10 MG tablet Take 10 mg by mouth.     traZODone  (DESYREL ) 150 MG tablet Take 300 mg by mouth.     DULoxetine  (CYMBALTA ) 60 MG capsule Take 2 capsules (120 mg total) by mouth daily. 60 capsule 1   No current  facility-administered medications for this visit.    Allergies as of 06/23/2023 - Review Complete 06/23/2023  Allergen Reaction Noted   Azithromycin Hives 09/23/2016   Erythromycin Hives 06/22/2023    Vitals: BP 105/70   Pulse 70   Ht 5\' 7"  (1.702 m)   Wt 236 lb 12.8 oz (107.4 kg)   BMI 37.09 kg/m  Last Weight:  Wt Readings from Last 1 Encounters:  06/23/23 236 lb 12.8 oz (107.4 kg)   Last Height:   Ht Readings from Last 1 Encounters:  06/23/23 5\' 7"  (1.702 m)     Physical exam: Exam: Gen: NAD, conversant, well nourised, obese, well groomed                     CV: RRR, no MRG. No Carotid Bruits. No peripheral edema, warm, nontender Eyes: Conjunctivae clear without exudates or hemorrhage  Neuro: Detailed Neurologic Exam  Speech:    Speech is normal; fluent and spontaneous with normal comprehension.  Cognition:    The patient is oriented to person, place, and time;     recent and remote memory intact;     language fluent;     normal attention, concentration,     fund of knowledge Cranial Nerves:    The pupils are equal, round, and reactive to light. The fundi are normal and spontaneous venous pulsations are present. Visual fields are full to finger confrontation. Extraocular movements are intact. Trigeminal sensation is intact and the muscles of mastication are normal. The face is symmetric. The palate elevates in the midline. Hearing intact. Voice is normal. Shoulder shrug is normal. The tongue has normal motion without fasciculations.   Coordination: nml  Gait: nml  Motor Observation:    No asymmetry, no atrophy, and no involuntary movements noted. Tone:    Normal muscle tone.    Posture:    Posture is normal. normal erect    Strength:    Strength is V/V in the upper and lower limbs.      Sensation: intact to LT     Reflex Exam:  DTR's:    Deep tendon reflexes in the upper and lower extremities are normal bilaterally.   Toes:    The toes are  downgoing bilaterally.   Clonus:    Clonus is absent.    Assessment/Plan:  Patient with intractable headache  Lumbar puncture MRI of the brain w/wo contrast: MRI brain due to concerning symptoms of morning headaches, positional and exertional headaches,vision changes, worsening headaches  to look for space occupying mass, chiari or intracranial hypertension (pseudotumor), strokes, malignancies, vasculidities, demyelination(multiple sclerosis) or other  Differential: Status migrainosus or IDIOPATHIC INTRACRANIAL HYPERTENSION  Take Nurtec the next 1-2 weeks daily  Orders Placed This Encounter  Procedures   DG FL GUIDED LUMBAR PUNCTURE   MR BRAIN W WO CONTRAST   Meds ordered this encounter  Medications   Rimegepant Sulfate (NURTEC) 75 MG TBDP    Sig: Take 1 tablet (75 mg total) by mouth daily. For migraines. Take as close to onset of migraine as possible. One daily maximum.    Dispense:  14 tablet    Refill:  0    Cc: Sheralyn Dies, FNP,  Patient, No Pcp Per  Aldona Amel, MD  [facility] [address] [place], [place] [address]  Phone [phone] Fax [phone]

## 2023-06-23 NOTE — Telephone Encounter (Signed)
 Called and LMVM for [place] at [facility] [phone] that Dr. Tresia Fruit put in order for LP for pt.  Just to let you know and try to get in end this week or first of next week.

## 2023-06-27 ENCOUNTER — Encounter: Payer: Self-pay | Admitting: Neurology

## 2023-07-27 ENCOUNTER — Other Ambulatory Visit: Payer: Self-pay

## 2023-07-30 ENCOUNTER — Encounter: Payer: Self-pay | Admitting: Neurology

## 2023-08-02 ENCOUNTER — Other Ambulatory Visit

## 2023-08-12 NOTE — Discharge Instructions (Signed)

## 2023-08-13 ENCOUNTER — Ambulatory Visit
Admission: RE | Admit: 2023-08-13 | Discharge: 2023-08-13 | Disposition: A | Discharge status: Home / Self Care | Source: Ambulatory Visit | Attending: Neurology | Admitting: Neurology

## 2023-08-13 VITALS — BP 114/66 | HR 64

## 2023-08-13 DIAGNOSIS — G43109 Migraine with aura, not intractable, without status migrainosus: Secondary | ICD-10-CM

## 2023-08-13 DIAGNOSIS — R519 Headache, unspecified: Secondary | ICD-10-CM

## 2023-08-13 LAB — CSF CELL COUNT WITH DIFFERENTIAL
RBC Count, CSF: 22 {cells}/uL — ABNORMAL HIGH
TOTAL NUCLEATED CELL: 1 {cells}/uL (ref 0–5)

## 2023-08-13 LAB — GLUCOSE, CSF: Glucose, CSF: 65 mg/dL (ref 40–80)

## 2023-08-13 LAB — PROTEIN, CSF: Total Protein, CSF: 37 mg/dL (ref 15–45)

## 2023-08-16 ENCOUNTER — Encounter: Payer: Self-pay | Admitting: Neurology

## 2023-08-16 ENCOUNTER — Ambulatory Visit: Payer: Self-pay | Admitting: Neurology

## 2023-08-17 ENCOUNTER — Other Ambulatory Visit: Payer: Self-pay | Admitting: Neurology

## 2023-08-17 MED ORDER — ACETAZOLAMIDE 250 MG PO TABS: 250.0000 mg | ORAL_TABLET | Freq: Two times a day (BID) | ORAL | 6 refills | Status: DC

## 2023-08-18 ENCOUNTER — Other Ambulatory Visit: Payer: Self-pay | Admitting: Neurology

## 2023-08-18 ENCOUNTER — Other Ambulatory Visit: Payer: Self-pay | Admitting: *Deleted

## 2023-08-18 MED ORDER — ACETAZOLAMIDE 250 MG PO TABS: 250.0000 mg | ORAL_TABLET | Freq: Two times a day (BID) | ORAL | 6 refills | Status: DC

## 2023-09-02 ENCOUNTER — Telehealth: Payer: Self-pay | Admitting: Neurology

## 2023-09-02 NOTE — Telephone Encounter (Signed)
 [facility] cancellation Provideer out

## 2023-09-23 ENCOUNTER — Ambulatory Visit: Admitting: Neurology

## 2024-02-29 ENCOUNTER — Other Ambulatory Visit: Payer: Self-pay

## 2024-02-29 MED ORDER — ACETAZOLAMIDE 250 MG PO TABS: 250.0000 mg | ORAL_TABLET | Freq: Two times a day (BID) | ORAL | 6 refills | Status: AC

## 2024-02-29 NOTE — Telephone Encounter (Signed)
 Received refill request but you haven't seen. I do not feel comfortable filling medication unless approved by you since they have not yet established care with you
